# Patient Record
Sex: Male | Born: 1975 | Race: Black or African American | Hispanic: No | Marital: Single | Smoking: Current every day smoker
Health system: Southern US, Community
[De-identification: ages and names within clinical notes are randomized; demographics above are authoritative.]

## PROBLEM LIST (undated history)

## (undated) DIAGNOSIS — K219 Gastro-esophageal reflux disease without esophagitis: Secondary | ICD-10-CM

## (undated) HISTORY — PX: NO PAST SURGERIES: SHX2092

---

## 2013-09-05 ENCOUNTER — Encounter (HOSPITAL_COMMUNITY): Payer: Self-pay | Admitting: Emergency Medicine

## 2013-09-05 ENCOUNTER — Emergency Department (HOSPITAL_COMMUNITY)
Admission: EM | Admit: 2013-09-05 | Discharge: 2013-09-05 | Disposition: A | Discharge status: Home / Self Care | Payer: Self-pay | Attending: Emergency Medicine | Admitting: Emergency Medicine

## 2013-09-05 DIAGNOSIS — S199XXA Unspecified injury of neck, initial encounter: Secondary | ICD-10-CM

## 2013-09-05 DIAGNOSIS — R519 Headache, unspecified: Secondary | ICD-10-CM

## 2013-09-05 DIAGNOSIS — S0993XA Unspecified injury of face, initial encounter: Secondary | ICD-10-CM | POA: Insufficient documentation

## 2013-09-05 DIAGNOSIS — K5289 Other specified noninfective gastroenteritis and colitis: Secondary | ICD-10-CM | POA: Insufficient documentation

## 2013-09-05 DIAGNOSIS — K529 Noninfective gastroenteritis and colitis, unspecified: Secondary | ICD-10-CM

## 2013-09-05 DIAGNOSIS — S0990XA Unspecified injury of head, initial encounter: Secondary | ICD-10-CM | POA: Insufficient documentation

## 2013-09-05 DIAGNOSIS — R51 Headache: Secondary | ICD-10-CM

## 2013-09-05 LAB — URINALYSIS, ROUTINE W REFLEX MICROSCOPIC
Glucose, UA: NEGATIVE mg/dL
Hgb urine dipstick: NEGATIVE
Ketones, ur: NEGATIVE mg/dL
Leukocytes, UA: NEGATIVE
NITRITE: NEGATIVE
Protein, ur: 30 mg/dL — AB
Specific Gravity, Urine: 1.043 — ABNORMAL HIGH (ref 1.005–1.030)
UROBILINOGEN UA: 0.2 mg/dL (ref 0.0–1.0)
pH: 5.5 (ref 5.0–8.0)

## 2013-09-05 LAB — COMPREHENSIVE METABOLIC PANEL
ALT: 12 U/L (ref 0–53)
AST: 15 U/L (ref 0–37)
Albumin: 3.7 g/dL (ref 3.5–5.2)
Alkaline Phosphatase: 63 U/L (ref 39–117)
BILIRUBIN TOTAL: 0.4 mg/dL (ref 0.3–1.2)
BUN: 9 mg/dL (ref 6–23)
CALCIUM: 9 mg/dL (ref 8.4–10.5)
CHLORIDE: 98 meq/L (ref 96–112)
CO2: 22 mEq/L (ref 19–32)
CREATININE: 0.91 mg/dL (ref 0.50–1.35)
GLUCOSE: 144 mg/dL — AB (ref 70–99)
Potassium: 3.5 mEq/L — ABNORMAL LOW (ref 3.7–5.3)
Sodium: 136 mEq/L — ABNORMAL LOW (ref 137–147)
Total Protein: 6.9 g/dL (ref 6.0–8.3)

## 2013-09-05 LAB — CBC WITH DIFFERENTIAL/PLATELET
Basophils Absolute: 0 10*3/uL (ref 0.0–0.1)
Basophils Relative: 0 % (ref 0–1)
EOS PCT: 1 % (ref 0–5)
Eosinophils Absolute: 0.1 10*3/uL (ref 0.0–0.7)
HCT: 38.1 % — ABNORMAL LOW (ref 39.0–52.0)
HEMOGLOBIN: 13.3 g/dL (ref 13.0–17.0)
LYMPHS PCT: 6 % — AB (ref 12–46)
Lymphs Abs: 0.5 10*3/uL — ABNORMAL LOW (ref 0.7–4.0)
MCH: 30.6 pg (ref 26.0–34.0)
MCHC: 34.9 g/dL (ref 30.0–36.0)
MCV: 87.6 fL (ref 78.0–100.0)
MONO ABS: 0.2 10*3/uL (ref 0.1–1.0)
MONOS PCT: 3 % (ref 3–12)
NEUTROS ABS: 7.7 10*3/uL (ref 1.7–7.7)
Neutrophils Relative %: 90 % — ABNORMAL HIGH (ref 43–77)
Platelets: 199 10*3/uL (ref 150–400)
RBC: 4.35 MIL/uL (ref 4.22–5.81)
RDW: 12.3 % (ref 11.5–15.5)
WBC: 8.5 10*3/uL (ref 4.0–10.5)

## 2013-09-05 LAB — URINE MICROSCOPIC-ADD ON

## 2013-09-05 LAB — GC/CHLAMYDIA PROBE AMP
CT PROBE, AMP APTIMA: POSITIVE — AB
GC Probe RNA: NEGATIVE

## 2013-09-05 MED ORDER — AZITHROMYCIN 250 MG PO TABS
1000.0000 mg | ORAL_TABLET | Freq: Once | ORAL | Status: AC
  Administered 2013-09-05: 1000 mg via ORAL
  Filled 2013-09-05: qty 4

## 2013-09-05 MED ORDER — ONDANSETRON HCL 4 MG/2ML IJ SOLN
4.0000 mg | Freq: Once | INTRAMUSCULAR | Status: AC
  Administered 2013-09-05: 4 mg via INTRAVENOUS
  Filled 2013-09-05: qty 2

## 2013-09-05 MED ORDER — AZITHROMYCIN 250 MG PO TABS: 1000.0000 mg | ORAL_TABLET | Freq: Once | ORAL | Status: DC

## 2013-09-05 MED ORDER — MORPHINE SULFATE 4 MG/ML IJ SOLN
4.0000 mg | Freq: Once | INTRAMUSCULAR | Status: AC
Start: 2013-09-05 — End: 2013-09-05
  Administered 2013-09-05: 4 mg via INTRAVENOUS
  Filled 2013-09-05: qty 1

## 2013-09-05 MED ORDER — CEFTRIAXONE SODIUM 250 MG IJ SOLR
250.0000 mg | Freq: Once | INTRAMUSCULAR | Status: AC
  Administered 2013-09-05: 250 mg via INTRAMUSCULAR
  Filled 2013-09-05: qty 250

## 2013-09-05 MED ORDER — ONDANSETRON 4 MG PO TBDP: 4.0000 mg | ORAL_TABLET | Freq: Three times a day (TID) | ORAL | Status: DC | PRN

## 2013-09-05 MED ORDER — SODIUM CHLORIDE 0.9 % IV BOLUS (SEPSIS)
1000.0000 mL | Freq: Once | INTRAVENOUS | Status: AC
  Administered 2013-09-05: 1000 mL via INTRAVENOUS
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
## 2013-09-05 NOTE — ED Notes (Signed)
Pt is unable to void at this time.  

## 2013-09-05 NOTE — ED Notes (Signed)
Patient vomited on floor of room--Zithromax tabs noted PA made aware

## 2013-09-05 NOTE — ED Notes (Signed)
Patient now states that he wants to be tested for STDs PA aware

## 2013-09-05 NOTE — Discharge Instructions (Signed)
Please follow up with your primary care physician in 1-2 days. If you do not have one please call the [facility] number listed above. Please take your antibiotic until completion. Please take Zofran as prescribed. Please read all discharge instructions and return precautions.   Viral Gastroenteritis Viral gastroenteritis is also known as stomach flu. This condition affects the stomach and intestinal tract. It can cause sudden diarrhea and vomiting. The illness typically lasts 3 to 8 days. Most people develop an immune response that eventually gets rid of the virus. While this natural response develops, the virus can make you quite ill. CAUSES  Many different viruses can cause gastroenteritis, such as rotavirus or noroviruses. You can catch one of these viruses by consuming contaminated food or water. You may also catch a virus by sharing utensils or other personal items with an infected person or by touching a contaminated surface. SYMPTOMS  The most common symptoms are diarrhea and vomiting. These problems can cause a severe loss of body fluids (dehydration) and a body salt (electrolyte) imbalance. Other symptoms may include:  Fever.  Headache.  Fatigue.  Abdominal pain. DIAGNOSIS  Your caregiver can usually diagnose viral gastroenteritis based on your symptoms and a physical exam. A stool sample may also be taken to test for the presence of viruses or other infections. TREATMENT  This illness typically goes away on its own. Treatments are aimed at rehydration. The most serious cases of viral gastroenteritis involve vomiting so severely that you are not able to keep fluids down. In these cases, fluids must be given through an intravenous line (IV). HOME CARE INSTRUCTIONS   Drink enough fluids to keep your urine clear or pale yellow. Drink small amounts of fluids frequently and increase the amounts as tolerated.  Ask your caregiver for specific rehydration  instructions.  Avoid:  Foods high in sugar.  Alcohol.  Carbonated drinks.  Tobacco.  Juice.  Caffeine drinks.  Extremely hot or cold fluids.  Fatty, greasy foods.  Too much intake of anything at one time.  Dairy products until 24 to 48 hours after diarrhea stops.  You may consume probiotics. Probiotics are active cultures of beneficial bacteria. They may lessen the amount and number of diarrheal stools in adults. Probiotics can be found in yogurt with active cultures and in supplements.  Wash your hands well to avoid spreading the virus.  Only take over-the-counter or prescription medicines for pain, discomfort, or fever as directed by your caregiver. Do not give aspirin to children. Antidiarrheal medicines are not recommended.  Ask your caregiver if you should continue to take your regular prescribed and over-the-counter medicines.  Keep all follow-up appointments as directed by your caregiver. SEEK IMMEDIATE MEDICAL CARE IF:   You are unable to keep fluids down.  You do not urinate at least once every 6 to 8 hours.  You develop shortness of breath.  You notice blood in your stool or vomit. This may look like coffee grounds.  You have abdominal pain that increases or is concentrated in one small area (localized).  You have persistent vomiting or diarrhea.  You have a fever.  The patient is a child younger than 3 months, and he or she has a fever.  The patient is a child older than 3 months, and he or she has a fever and persistent symptoms.  The patient is a child older than 3 months, and he or she has a fever and symptoms suddenly get worse.  The patient  is a baby, and he or she has no tears when crying. MAKE SURE YOU:   Understand these instructions.  Will watch your condition.  Will get help right away if you are not doing well or get worse. Document Released: 07/03/2005 Document Revised: 09/25/2011 Document Reviewed: 04/19/2011 [facility] Patient  Information 2014 [place], [place].  Sexually Transmitted Disease A sexually transmitted disease (STD) is a disease or infection that may be passed (transmitted) from person to person, usually during sexual activity. This may happen by way of saliva, semen, blood, vaginal mucus, or urine. Common STDs include:   Gonorrhea.   Chlamydia.   Syphilis.   HIV and AIDS.   Genital herpes.   Hepatitis B and C.   Trichomonas.   Human papillomavirus (HPV).   Pubic lice.   Scabies.  Mites.  Bacterial vaginosis. WHAT ARE CAUSES OF STDs? An STD may be caused by bacteria, a virus, or parasites. STDs are often transmitted during sexual activity if one person is infected. However, they may also be transmitted through nonsexual means. STDs may be transmitted after:   Sexual intercourse with an infected person.   Sharing sex toys with an infected person.   Sharing needles with an infected person or using unclean piercing or tattoo needles.  Having intimate contact with the genitals, mouth, or rectal areas of an infected person.   Exposure to infected fluids during birth. WHAT ARE THE SIGNS AND SYMPTOMS OF STDs? Different STDs have different symptoms. Some people may not have any symptoms. If symptoms are present, they may include:   Painful or bloody urination.   Pain in the pelvis, abdomen, vagina, anus, throat, or eyes.   Skin rash, itching, irritation, growths, sores (lesions), ulcerations, or warts in the genital or anal area.  Abnormal vaginal discharge with or without bad odor.   Penile discharge in men.   Fever.   Pain or bleeding during sexual intercourse.   Swollen glands in the groin area.   Yellow skin and eyes (jaundice). This is seen with hepatitis.   Swollen testicles.  Infertility.  Sores and blisters in the mouth. HOW ARE STDs DIAGNOSED? To make a diagnosis, your health care provider may:   Take a medical history.   Perform a  physical exam.   Take a sample of any discharge for examination.  Swab the throat, cervix, opening to the penis, rectum, or vagina for testing.  Test a sample of your first morning urine.   Perform blood tests.   Perform a Pap smear, if this applies.   Perform a colposcopy.   Perform a laparoscopy.  HOW ARE STDs TREATED? Treatment depends on the STD. Some STDs may be treated but not cured.   Chlamydia, gonorrhea, trichomonas, and syphilis can be cured with antibiotics.   Genital herpes, hepatitis, and HIV can be treated, but not cured, with prescribed medicines. The medicines lessen symptoms.   Genital warts from HPV can be treated with medicine or by freezing, burning (electrocautery), or surgery. Warts may come back.   HPV cannot be cured with medicine or surgery. However, abnormal areas may be removed from the cervix, vagina, or vulva.   If your diagnosis is confirmed, your recent sexual partners need treatment. This is true even if they are symptom-free or have a negative culture or evaluation. They should not have sex until their health care providers say it is OK. HOW CAN I REDUCE MY RISK OF GETTING AN STD?  Use latex condoms, dental dams, and water-soluble lubricants  during sexual activity. Do not use petroleum jelly or oils.  Get vaccinated for HPV and hepatitis. If you have not received these vaccines in the past, talk to your health care provider about whether one or both might be right for you.   Avoid risky sex practices that can break the skin.  WHAT SHOULD I DO IF I THINK I HAVE AN STD?  See your health care provider.   Inform all sexual partners. They should be tested and treated for any STDs.  Do not have sex until your health care provider says it is OK. WHEN SHOULD I GET HELP? Seek immediate medical care if:  You develop severe abdominal pain.  You are a man and notice swelling or pain in the testicles.  You are a woman and notice  swelling or pain in your vagina. Document Released: 09/23/2002 Document Revised: 04/23/2013 Document Reviewed: 01/21/2013 [facility] Patient Information 2014 [place], [place].

## 2013-09-05 NOTE — ED Notes (Signed)
[facility] Expected date:  Expected time:  Means of arrival:  Comments: EMS/N/V/D

## 2013-09-05 NOTE — ED Provider Notes (Signed)
Medical screening examination/treatment/procedure(s) were performed by non-physician practitioner and as supervising physician I was immediately available for consultation/collaboration.  EKG Interpretation   None        Nidia Grogan K Longino Trefz-Rasch, MD 09/05/13 0613 

## 2013-09-05 NOTE — ED Notes (Signed)
Pt unable to void at this time. 
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
## 2013-09-05 NOTE — ED Notes (Signed)
[id number] house

## 2013-09-08 ENCOUNTER — Telehealth (HOSPITAL_COMMUNITY): Payer: Self-pay | Admitting: *Deleted

## 2013-09-26 ENCOUNTER — Encounter (HOSPITAL_COMMUNITY): Payer: Self-pay | Admitting: Emergency Medicine

## 2013-09-26 ENCOUNTER — Emergency Department (HOSPITAL_COMMUNITY)
Admission: EM | Admit: 2013-09-26 | Discharge: 2013-09-26 | Disposition: A | Discharge status: Home / Self Care | Payer: Self-pay | Attending: Emergency Medicine | Admitting: Emergency Medicine

## 2013-09-26 DIAGNOSIS — A64 Unspecified sexually transmitted disease: Secondary | ICD-10-CM | POA: Insufficient documentation

## 2013-09-26 DIAGNOSIS — R110 Nausea: Secondary | ICD-10-CM | POA: Insufficient documentation

## 2013-09-26 LAB — BASIC METABOLIC PANEL
BUN: 11 mg/dL (ref 6–23)
CHLORIDE: 104 meq/L (ref 96–112)
CO2: 26 meq/L (ref 19–32)
CREATININE: 0.99 mg/dL (ref 0.50–1.35)
Calcium: 9.4 mg/dL (ref 8.4–10.5)
GFR calc Af Amer: >90 mL/min (ref >90)
GFR calc non Af Amer: >90 mL/min (ref >90)
Glucose, Bld: 83 mg/dL (ref 70–99)
Potassium: 3.5 mEq/L — ABNORMAL LOW (ref 3.7–5.3)
Sodium: 142 mEq/L (ref 137–147)

## 2013-09-26 LAB — CBC WITH DIFFERENTIAL/PLATELET
Basophils Absolute: 0 10*3/uL (ref 0.0–0.1)
Basophils Relative: 0 % (ref 0–1)
Eosinophils Absolute: 0.2 10*3/uL (ref 0.0–0.7)
Eosinophils Relative: 4 % (ref 0–5)
HCT: 40.7 % (ref 39.0–52.0)
HEMOGLOBIN: 14.3 g/dL (ref 13.0–17.0)
Lymphocytes Relative: 56 % — ABNORMAL HIGH (ref 12–46)
Lymphs Abs: 3 10*3/uL (ref 0.7–4.0)
MCH: 31.4 pg (ref 26.0–34.0)
MCHC: 35.1 g/dL (ref 30.0–36.0)
MCV: 89.5 fL (ref 78.0–100.0)
MONOS PCT: 8 % (ref 3–12)
Monocytes Absolute: 0.5 10*3/uL (ref 0.1–1.0)
NEUTROS ABS: 1.7 10*3/uL (ref 1.7–7.7)
Neutrophils Relative %: 31 % — ABNORMAL LOW (ref 43–77)
Platelets: 240 10*3/uL (ref 150–400)
RBC: 4.55 MIL/uL (ref 4.22–5.81)
RDW: 12.9 % (ref 11.5–15.5)
WBC: 5.5 10*3/uL (ref 4.0–10.5)

## 2013-09-26 LAB — URINALYSIS, ROUTINE W REFLEX MICROSCOPIC
Bilirubin Urine: NEGATIVE
GLUCOSE, UA: NEGATIVE mg/dL
Hgb urine dipstick: NEGATIVE
KETONES UR: NEGATIVE mg/dL
LEUKOCYTES UA: NEGATIVE
Nitrite: NEGATIVE
Protein, ur: NEGATIVE mg/dL
Specific Gravity, Urine: 1.018 (ref 1.005–1.030)
Urobilinogen, UA: 0.2 mg/dL (ref 0.0–1.0)
pH: 5.5 (ref 5.0–8.0)

## 2013-09-26 MED ORDER — DOXYCYCLINE HYCLATE 100 MG PO TABS
100.0000 mg | ORAL_TABLET | Freq: Once | ORAL | Status: AC
  Administered 2013-09-26: 100 mg via ORAL
  Filled 2013-09-26: qty 1

## 2013-09-26 MED ORDER — DOXYCYCLINE HYCLATE 100 MG PO CAPS: 100.0000 mg | ORAL_CAPSULE | Freq: Two times a day (BID) | ORAL | Status: DC

## 2013-09-26 MED ORDER — PROMETHAZINE HCL 25 MG PO TABS: 25.0000 mg | ORAL_TABLET | Freq: Four times a day (QID) | ORAL | Status: DC | PRN

## 2013-09-26 NOTE — ED Provider Notes (Signed)
Medical screening examination/treatment/procedure(s) were performed by non-physician practitioner and as supervising physician I was immediately available for consultation/collaboration.   EKG Interpretation None        William Luv Mish, MD 09/26/13 2319 
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
## 2013-09-26 NOTE — Discharge Instructions (Signed)
Sexually Transmitted Disease A sexually transmitted disease (STD) is a disease or infection that may be passed (transmitted) from person to person, usually during sexual activity. This may happen by way of saliva, semen, blood, vaginal mucus, or urine. Common STDs include:   Gonorrhea.   Chlamydia.   Syphilis.   HIV and AIDS.   Genital herpes.   Hepatitis B and C.   Trichomonas.   Human papillomavirus (HPV).   Pubic lice.   Scabies.  Mites.  Bacterial vaginosis. WHAT ARE CAUSES OF STDs? An STD may be caused by bacteria, a virus, or parasites. STDs are often transmitted during sexual activity if one person is infected. However, they may also be transmitted through nonsexual means. STDs may be transmitted after:   Sexual intercourse with an infected person.   Sharing sex toys with an infected person.   Sharing needles with an infected person or using unclean piercing or tattoo needles.  Having intimate contact with the genitals, mouth, or rectal areas of an infected person.   Exposure to infected fluids during birth. WHAT ARE THE SIGNS AND SYMPTOMS OF STDs? Different STDs have different symptoms. Some people may not have any symptoms. If symptoms are present, they may include:   Painful or bloody urination.   Pain in the pelvis, abdomen, vagina, anus, throat, or eyes.   Skin rash, itching, irritation, growths, sores (lesions), ulcerations, or warts in the genital or anal area.  Abnormal vaginal discharge with or without bad odor.   Penile discharge in men.   Fever.   Pain or bleeding during sexual intercourse.   Swollen glands in the groin area.   Yellow skin and eyes (jaundice). This is seen with hepatitis.   Swollen testicles.  Infertility.  Sores and blisters in the mouth. HOW ARE STDs DIAGNOSED? To make a diagnosis, your health care provider may:   Take a medical history.   Perform a physical exam.   Take a sample of any  discharge for examination.  Swab the throat, cervix, opening to the penis, rectum, or vagina for testing.  Test a sample of your first morning urine.   Perform blood tests.   Perform a Pap smear, if this applies.   Perform a colposcopy.   Perform a laparoscopy.  HOW ARE STDs TREATED? Treatment depends on the STD. Some STDs may be treated but not cured.   Chlamydia, gonorrhea, trichomonas, and syphilis can be cured with antibiotics.   Genital herpes, hepatitis, and HIV can be treated, but not cured, with prescribed medicines. The medicines lessen symptoms.   Genital warts from HPV can be treated with medicine or by freezing, burning (electrocautery), or surgery. Warts may come back.   HPV cannot be cured with medicine or surgery. However, abnormal areas may be removed from the cervix, vagina, or vulva.   If your diagnosis is confirmed, your recent sexual partners need treatment. This is true even if they are symptom-free or have a negative culture or evaluation. They should not have sex until their health care providers say it is OK. HOW CAN I REDUCE MY RISK OF GETTING AN STD?  Use latex condoms, dental dams, and water-soluble lubricants during sexual activity. Do not use petroleum jelly or oils.  Get vaccinated for HPV and hepatitis. If you have not received these vaccines in the past, talk to your health care provider about whether one or both might be right for you.   Avoid risky sex practices that can break the skin.  WHAT SHOULD   I DO IF I THINK I HAVE AN STD?  See your health care provider.   Inform all sexual partners. They should be tested and treated for any STDs.  Do not have sex until your health care provider says it is OK. WHEN SHOULD I GET HELP? Seek immediate medical care if:  You develop severe abdominal pain.  You are a man and notice swelling or pain in the testicles.  You are a woman and notice swelling or pain in your vagina. Document  Released: 09/23/2002 Document Revised: 04/23/2013 Document Reviewed: 01/21/2013 [employer] Patient Information 2014 [employer].  
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
## 2013-09-26 NOTE — ED Notes (Signed)
Pt alert, NAD, calm, interactive. 

## 2013-09-27 LAB — GC/CHLAMYDIA PROBE AMP
CT Probe RNA: NEGATIVE
GC Probe RNA: NEGATIVE

## 2014-04-21 ENCOUNTER — Emergency Department (HOSPITAL_COMMUNITY)
Admission: EM | Admit: 2014-04-21 | Discharge: 2014-04-21 | Disposition: A | Discharge status: Home / Self Care | Payer: Self-pay | Attending: Emergency Medicine | Admitting: Emergency Medicine

## 2014-04-21 ENCOUNTER — Encounter (HOSPITAL_COMMUNITY): Payer: Self-pay | Admitting: Emergency Medicine

## 2014-04-21 DIAGNOSIS — Z711 Person with feared health complaint in whom no diagnosis is made: Secondary | ICD-10-CM

## 2014-04-21 DIAGNOSIS — R300 Dysuria: Secondary | ICD-10-CM | POA: Insufficient documentation

## 2014-04-21 DIAGNOSIS — Z113 Encounter for screening for infections with a predominantly sexual mode of transmission: Secondary | ICD-10-CM | POA: Insufficient documentation

## 2014-04-21 LAB — URINALYSIS, ROUTINE W REFLEX MICROSCOPIC
Bilirubin Urine: NEGATIVE
GLUCOSE, UA: NEGATIVE mg/dL
HGB URINE DIPSTICK: NEGATIVE
Ketones, ur: NEGATIVE mg/dL
Nitrite: NEGATIVE
Protein, ur: NEGATIVE mg/dL
Specific Gravity, Urine: 1.019 (ref 1.005–1.030)
Urobilinogen, UA: 1 mg/dL (ref 0.0–1.0)
pH: 6 (ref 5.0–8.0)

## 2014-04-21 LAB — URINE MICROSCOPIC-ADD ON

## 2014-04-21 MED ORDER — STERILE WATER FOR INJECTION IJ SOLN
INTRAMUSCULAR | Status: AC
  Administered 2014-04-21: 10 mL
  Filled 2014-04-21: qty 10

## 2014-04-21 MED ORDER — AZITHROMYCIN 250 MG PO TABS
1000.0000 mg | ORAL_TABLET | Freq: Once | ORAL | Status: AC
Start: 2014-04-21 — End: 2014-04-21
  Administered 2014-04-21: 1000 mg via ORAL
  Filled 2014-04-21: qty 4

## 2014-04-21 MED ORDER — CEFTRIAXONE SODIUM 250 MG IJ SOLR
250.0000 mg | Freq: Once | INTRAMUSCULAR | Status: AC
  Administered 2014-04-21: 250 mg via INTRAMUSCULAR
  Filled 2014-04-21: qty 250

## 2014-04-21 NOTE — ED Provider Notes (Signed)
Medical screening examination/treatment/procedure(s) were performed by non-physician practitioner and as supervising physician I was immediately available for consultation/collaboration.    Kamoni Gentles, MD 04/21/14 1614 
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
## 2014-04-21 NOTE — Discharge Instructions (Signed)
You were treated today for both gonorrhea and Chlamydia. If these tests results positive, you will be contacted and are than obligated to inform your partner. Sexually Transmitted Disease A sexually transmitted disease (STD) is a disease or infection that may be passed (transmitted) from person to person, usually during sexual activity. This may happen by way of saliva, semen, blood, vaginal mucus, or urine. Common STDs include:   Gonorrhea.   Chlamydia.   Syphilis.   HIV and AIDS.   Genital herpes.   Hepatitis B and C.   Trichomonas.   Human papillomavirus (HPV).   Pubic lice.   Scabies.  Mites.  Bacterial vaginosis. WHAT ARE CAUSES OF STDs? An STD may be caused by bacteria, a virus, or parasites. STDs are often transmitted during sexual activity if one person is infected. However, they may also be transmitted through nonsexual means. STDs may be transmitted after:   Sexual intercourse with an infected person.   Sharing sex toys with an infected person.   Sharing needles with an infected person or using unclean piercing or tattoo needles.  Having intimate contact with the genitals, mouth, or rectal areas of an infected person.   Exposure to infected fluids during birth. WHAT ARE THE SIGNS AND SYMPTOMS OF STDs? Different STDs have different symptoms. Some people may not have any symptoms. If symptoms are present, they may include:   Painful or bloody urination.   Pain in the pelvis, abdomen, vagina, anus, throat, or eyes.   A skin rash, itching, or irritation.  Growths, ulcerations, blisters, or sores in the genital and anal areas.  Abnormal vaginal discharge with or without bad odor.   Penile discharge in men.   Fever.   Pain or bleeding during sexual intercourse.   Swollen glands in the groin area.   Yellow skin and eyes (jaundice). This is seen with hepatitis.   Swollen testicles.  Infertility.  Sores and blisters in the  mouth. HOW ARE STDs DIAGNOSED? To make a diagnosis, your health care provider may:   Take a medical history.   Perform a physical exam.   Take a sample of any discharge to examine.  Swab the throat, cervix, opening to the penis, rectum, or vagina for testing.  Test a sample of your first morning urine.   Perform blood tests.   Perform a Pap test, if this applies.   Perform a colposcopy.   Perform a laparoscopy.  HOW ARE STDs TREATED? Treatment depends on the STD. Some STDs may be treated but not cured.   Chlamydia, gonorrhea, trichomonas, and syphilis can be cured with antibiotic medicine.   Genital herpes, hepatitis, and HIV can be treated, but not cured, with prescribed medicines. The medicines lessen symptoms.   Genital warts from HPV can be treated with medicine or by freezing, burning (electrocautery), or surgery. Warts may come back.   HPV cannot be cured with medicine or surgery. However, abnormal areas may be removed from the cervix, vagina, or vulva.   If your diagnosis is confirmed, your recent sexual partners need treatment. This is true even if they are symptom-free or have a negative culture or evaluation. They should not have sex until their health care providers say it is okay. HOW CAN I REDUCE MY RISK OF GETTING AN STD? Take these steps to reduce your risk of getting an STD:  Use latex condoms, dental dams, and water-soluble lubricants during sexual activity. Do not use petroleum jelly or oils.  Avoid having multiple sex partners.  Do not have sex with someone who has other sex partners.  Do not have sex with anyone you do not know or who is at high risk for an STD.  Avoid risky sex practices that can break your skin.  Do not have sex if you have open sores on your mouth or skin.  Avoid drinking too much alcohol or taking illegal drugs. Alcohol and drugs can affect your judgment and put you in a vulnerable position.  Avoid engaging in oral  and anal sex acts.  Get vaccinated for HPV and hepatitis. If you have not received these vaccines in the past, talk to your health care provider about whether one or both might be right for you.   If you are at risk of being infected with HIV, it is recommended that you take a prescription medicine daily to prevent HIV infection. This is called pre-exposure prophylaxis (PrEP). You are considered at risk if:  You are a man who has sex with other men (MSM).  You are a heterosexual man or woman and are sexually active with more than one partner.  You take drugs by injection.  You are sexually active with a partner who has HIV.  Talk with your health care provider about whether you are at high risk of being infected with HIV. If you choose to begin PrEP, you should first be tested for HIV. You should then be tested every 3 months for as long as you are taking PrEP.  WHAT SHOULD I DO IF I THINK I HAVE AN STD?  See your health care provider.   Tell your sexual partner(s). They should be tested and treated for any STDs.  Do not have sex until your health care provider says it is okay. WHEN SHOULD I GET IMMEDIATE MEDICAL CARE? Contact your health care provider right away if:   You have severe abdominal pain.  You are a man and notice swelling or pain in your testicles.  You are a woman and notice swelling or pain in your vagina. Document Released: 09/23/2002 Document Revised: 07/08/2013 Document Reviewed: 01/21/2013 [facility] Patient Information 2015 [employer], [place]. This information is not intended to replace advice given to you by your health care provider. Make sure you discuss any questions you have with your health care provider.

## 2014-04-22 LAB — GC/CHLAMYDIA PROBE AMP
CT Probe RNA: NEGATIVE
GC PROBE AMP APTIMA: NEGATIVE

## 2015-01-07 ENCOUNTER — Emergency Department (HOSPITAL_COMMUNITY)
Admission: EM | Admit: 2015-01-07 | Discharge: 2015-01-08 | Disposition: A | Discharge status: Home / Self Care | Payer: Self-pay | Attending: Emergency Medicine | Admitting: Emergency Medicine

## 2015-01-07 ENCOUNTER — Encounter (HOSPITAL_COMMUNITY): Payer: Self-pay | Admitting: *Deleted

## 2015-01-07 DIAGNOSIS — Z720 Tobacco use: Secondary | ICD-10-CM | POA: Insufficient documentation

## 2015-01-07 DIAGNOSIS — R109 Unspecified abdominal pain: Secondary | ICD-10-CM | POA: Insufficient documentation

## 2015-01-07 DIAGNOSIS — K219 Gastro-esophageal reflux disease without esophagitis: Secondary | ICD-10-CM | POA: Insufficient documentation

## 2015-01-07 DIAGNOSIS — R110 Nausea: Secondary | ICD-10-CM | POA: Insufficient documentation

## 2015-01-07 HISTORY — DX: Gastro-esophageal reflux disease without esophagitis: K21.9

## 2015-01-07 LAB — CBC WITH DIFFERENTIAL/PLATELET
BASOS PCT: 0 % (ref 0–1)
Basophils Absolute: 0 10*3/uL (ref 0.0–0.1)
EOS ABS: 0.5 10*3/uL (ref 0.0–0.7)
Eosinophils Relative: 6 % — ABNORMAL HIGH (ref 0–5)
HEMATOCRIT: 46.3 % (ref 39.0–52.0)
Hemoglobin: 15.7 g/dL (ref 13.0–17.0)
Lymphocytes Relative: 41 % (ref 12–46)
Lymphs Abs: 3.1 10*3/uL (ref 0.7–4.0)
MCH: 32 pg (ref 26.0–34.0)
MCHC: 33.9 g/dL (ref 30.0–36.0)
MCV: 94.3 fL (ref 78.0–100.0)
MONO ABS: 0.7 10*3/uL (ref 0.1–1.0)
Monocytes Relative: 9 % (ref 3–12)
Neutro Abs: 3.2 10*3/uL (ref 1.7–7.7)
Neutrophils Relative %: 44 % (ref 43–77)
PLATELETS: 213 10*3/uL (ref 150–400)
RBC: 4.91 MIL/uL (ref 4.22–5.81)
RDW: 12.8 % (ref 11.5–15.5)
WBC: 7.5 10*3/uL (ref 4.0–10.5)

## 2015-01-07 NOTE — ED Notes (Signed)
Attempted lab draw x 2 but unsuccessful.RN,Travia made aware.

## 2015-01-07 NOTE — ED Notes (Signed)
Pt reports lower abd pain since last night .  Pt reports he had same pain in the past and was told he had GERD.  Pt also reports nausea.

## 2015-01-08 LAB — COMPREHENSIVE METABOLIC PANEL
ALT: 20 U/L (ref 17–63)
ANION GAP: 8 (ref 5–15)
AST: 27 U/L (ref 15–41)
Albumin: 4.7 g/dL (ref 3.5–5.0)
Alkaline Phosphatase: 89 U/L (ref 38–126)
BILIRUBIN TOTAL: 0.5 mg/dL (ref 0.3–1.2)
BUN: 14 mg/dL (ref 6–20)
CO2: 28 mmol/L (ref 22–32)
CREATININE: 1.04 mg/dL (ref 0.61–1.24)
Calcium: 9.3 mg/dL (ref 8.9–10.3)
Chloride: 106 mmol/L (ref 101–111)
GFR calc non Af Amer: >60 mL/min (ref >60)
GLUCOSE: 105 mg/dL — AB (ref 65–99)
Potassium: 3.5 mmol/L (ref 3.5–5.1)
Sodium: 142 mmol/L (ref 135–145)
Total Protein: 7.5 g/dL (ref 6.5–8.1)

## 2015-01-08 LAB — URINALYSIS, ROUTINE W REFLEX MICROSCOPIC
Glucose, UA: NEGATIVE mg/dL
Hgb urine dipstick: NEGATIVE
Ketones, ur: NEGATIVE mg/dL
Leukocytes, UA: NEGATIVE
NITRITE: NEGATIVE
Protein, ur: NEGATIVE mg/dL
Specific Gravity, Urine: 1.034 — ABNORMAL HIGH (ref 1.005–1.030)
UROBILINOGEN UA: 1 mg/dL (ref 0.0–1.0)
pH: 5.5 (ref 5.0–8.0)

## 2015-01-08 LAB — LIPASE, BLOOD: Lipase: 27 U/L (ref 22–51)

## 2015-01-08 LAB — GC/CHLAMYDIA PROBE AMP (~~LOC~~) NOT AT ARMC
Chlamydia: NEGATIVE
NEISSERIA GONORRHEA: NEGATIVE

## 2015-01-08 MED ORDER — SUCRALFATE 1 GM/10ML PO SUSP: 0.5000 g | Freq: Three times a day (TID) | ORAL | Status: DC

## 2015-01-08 MED ORDER — SUCRALFATE 1 GM/10ML PO SUSP
0.5000 g | Freq: Once | ORAL | Status: AC
  Administered 2015-01-08: 0.5 g via ORAL
  Filled 2015-01-08: qty 10

## 2015-01-08 NOTE — Discharge Instructions (Signed)
Please follow up with your primary care physician in 1-2 days. If you do not have one please call the [facility] number listed above. Please read all discharge instructions and return precautions.  ° °Gastroesophageal Reflux Disease, Adult °Gastroesophageal reflux disease (GERD) happens when acid from your stomach flows up into the esophagus. When acid comes in contact with the esophagus, the acid causes soreness (inflammation) in the esophagus. Over time, GERD may create small holes (ulcers) in the lining of the esophagus. °CAUSES  °· Increased body weight. This puts pressure on the stomach, making acid rise from the stomach into the esophagus. °· Smoking. This increases acid production in the stomach. °· Drinking alcohol. This causes decreased pressure in the lower esophageal sphincter (valve or ring of muscle between the esophagus and stomach), allowing acid from the stomach into the esophagus. °· Late evening meals and a full stomach. This increases pressure and acid production in the stomach. °· A malformed lower esophageal sphincter. °Sometimes, no cause is found. °SYMPTOMS  °· Burning pain in the lower part of the mid-chest behind the breastbone and in the mid-stomach area. This may occur twice a week or more often. °· Trouble swallowing. °· Sore throat. °· Dry cough. °· Asthma-like symptoms including chest tightness, shortness of breath, or wheezing. °DIAGNOSIS  °Your caregiver may be able to diagnose GERD based on your symptoms. In some cases, X-rays and other tests may be done to check for complications or to check the condition of your stomach and esophagus. °TREATMENT  °Your caregiver may recommend over-the-counter or prescription medicines to help decrease acid production. Ask your caregiver before starting or adding any new medicines.  °HOME CARE INSTRUCTIONS  °· Change the factors that you can control. Ask your caregiver for guidance concerning weight loss, quitting smoking, and  alcohol consumption. °· Avoid foods and drinks that make your symptoms worse, such as: °¨ Caffeine or alcoholic drinks. °¨ Chocolate. °¨ Peppermint or mint flavorings. °¨ Garlic and onions. °¨ Spicy foods. °¨ Citrus fruits, such as oranges, lemons, or limes. °¨ Tomato-based foods such as sauce, chili, salsa, and pizza. °¨ Fried and fatty foods. °· Avoid lying down for the 3 hours prior to your bedtime or prior to taking a nap. °· Eat small, frequent meals instead of large meals. °· Wear loose-fitting clothing. Do not wear anything tight around your waist that causes pressure on your stomach. °· Raise the head of your bed 6 to 8 inches with wood blocks to help you sleep. Extra pillows will not help. °· Only take over-the-counter or prescription medicines for pain, discomfort, or fever as directed by your caregiver. °· Do not take aspirin, ibuprofen, or other nonsteroidal anti-inflammatory drugs (NSAIDs). °SEEK IMMEDIATE MEDICAL CARE IF:  °· You have pain in your arms, neck, jaw, teeth, or back. °· Your pain increases or changes in intensity or duration. °· You develop nausea, vomiting, or sweating (diaphoresis). °· You develop shortness of breath, or you faint. °· Your vomit is green, yellow, black, or looks like coffee grounds or blood. °· Your stool is red, bloody, or black. °These symptoms could be signs of other problems, such as heart disease, gastric bleeding, or esophageal bleeding. °MAKE SURE YOU:  °· Understand these instructions. °· Will watch your condition. °· Will get help right away if you are not doing well or get worse. °Document Released: 04/12/2005 Document Revised: 09/25/2011 Document Reviewed: 01/20/2011 °ExitCare® Patient Information ©2015 [employer]. This information is not intended to replace   advice given to you by your health care provider. Make sure you discuss any questions you have with your health care provider. ° °

## 2015-01-08 NOTE — ED Provider Notes (Signed)
CSN: [id number]     Arrival date & time 01/07/15  2302 History   First MD Initiated Contact with Patient 01/07/15 2358     Chief Complaint  Patient presents with  . Abdominal Pain     (Consider location/radiation/quality/duration/timing/severity/associated sxs/prior Treatment) HPI Comments: Patient is a 39 year old male past medical history significant for tobacco abuse, GERD presenting to the emergency department for mid abdominal pain with associated "gargling" and nausea. He states he's had recurrent symptoms, last episode was earlier this evening. He states his symptoms are usually brought on with eating late at night, laying down after eating or eating fried or greasy or heavy foods. He has not tried any medications prior to arrival. No modifying factors identified. Patient is also requesting STD testing. He denies any fevers, vomiting, diarrhea, penile or testicular pain or swelling, penile discharge, urinary symptoms.  Patient is a 39 y.o. male presenting with abdominal pain.  Abdominal Pain Associated symptoms: nausea   Associated symptoms: no diarrhea, no hematuria and no vomiting     Past Medical History  Diagnosis Date  . GERD (gastroesophageal reflux disease)    History reviewed. No pertinent past surgical history. No family history on file. History  Substance Use Topics  . Smoking status: Current Every Day Smoker -- 0.50 packs/day    Types: Cigarettes  . Smokeless tobacco: Not on file  . Alcohol Use: Yes    Review of Systems  Gastrointestinal: Positive for nausea and abdominal pain. Negative for vomiting and diarrhea.  Genitourinary: Negative for hematuria, penile swelling, scrotal swelling, penile pain and testicular pain.  All other systems reviewed and are negative.     Allergies  Review of patient's allergies indicates no known allergies.  Home Medications   Prior to Admission medications   Medication Sig Start Date End Date Taking? Authorizing Provider   sucralfate (CARAFATE) 1 GM/10ML suspension Take 5 mLs (0.5 g total) by mouth 4 (four) times daily -  with meals and at bedtime. 01/08/15   Caelin Rayl, PA-C   BP 126/72 mmHg  Pulse 62  Temp(Src) 98.2 F (36.8 C) (Oral)  Resp 18  SpO2 99% Physical Exam  Constitutional: He is oriented to person, place, and time. He appears well-developed and well-nourished. No distress.  HENT:  Head: Normocephalic and atraumatic.  Right Ear: External ear normal.  Left Ear: External ear normal.  Nose: Nose normal.  Mouth/Throat: Oropharynx is clear and moist.  Eyes: Conjunctivae are normal.  Neck: Normal range of motion. Neck supple.  No nuchal rigidity.   Cardiovascular: Normal rate, regular rhythm and normal heart sounds.   Pulmonary/Chest: Effort normal and breath sounds normal.  Abdominal: Soft. Bowel sounds are normal. He exhibits no distension. There is no tenderness. There is no rebound and no guarding.  Musculoskeletal: Normal range of motion.  Neurological: He is alert and oriented to person, place, and time.  Skin: Skin is warm and dry. He is not diaphoretic.  Psychiatric: He has a normal mood and affect.  Nursing note and vitals reviewed.   ED Course  Procedures (including critical care time) Medications  sucralfate (CARAFATE) 1 GM/10ML suspension 0.5 g (0.5 g Oral Given 01/08/15 0049)    Labs Review Labs Reviewed  CBC WITH DIFFERENTIAL/PLATELET - Abnormal; Notable for the following:    Eosinophils Relative 6 (*)    All other components within normal limits  COMPREHENSIVE METABOLIC PANEL - Abnormal; Notable for the following:    Glucose, Bld 105 (*)    All other components  within normal limits  URINALYSIS, ROUTINE W REFLEX MICROSCOPIC (NOT AT [facility]) - Abnormal; Notable for the following:    Specific Gravity, Urine 1.034 (*)    Bilirubin Urine SMALL (*)    All other components within normal limits  LIPASE, BLOOD  HIV ANTIBODY (ROUTINE TESTING)  RPR  GC/CHLAMYDIA PROBE  AMP ([place]) NOT AT [facility]    Imaging Review No results found.   EKG Interpretation None      MDM   Final diagnoses:  Abdominal pain in male    Filed Vitals:   01/08/15 0242  BP: 126/72  Pulse: 62  Temp: 98.2 F (36.8 C)  Resp: 18   Afebrile, NAD, non-toxic appearing, AAOx4.   Patient is nontoxic, nonseptic appearing, in no apparent distress.  Patient's pain and other symptoms adequately managed in emergency department. Labs and vitals reviewed.  Patient does not meet the SIRS or Sepsis criteria.  No peritoneal signs.  No indication of appendicitis, bowel obstruction, bowel perforation, cholecystitis, diverticulitis. GC/Chlamydia, RPR, and HIV sent. No symptoms to suggest infection at this time, patient agreeable to holding treatment until test results are back.  Patient discharged home with symptomatic treatment and given strict instructions for follow-up with their primary care physician.  I have also discussed reasons to return immediately to the ER.  Patient expresses understanding and agrees with plan.Patient is stable at time of discharge        Francee Piccolo, PA-C 01/08/15 0981  Shon Baton, MD 01/08/15 [phone]

## 2015-01-11 ENCOUNTER — Encounter (HOSPITAL_COMMUNITY): Payer: Self-pay | Admitting: Emergency Medicine

## 2015-01-11 ENCOUNTER — Emergency Department (HOSPITAL_COMMUNITY)
Admission: EM | Admit: 2015-01-11 | Discharge: 2015-01-12 | Disposition: A | Discharge status: Home / Self Care | Payer: Self-pay | Attending: Emergency Medicine | Admitting: Emergency Medicine

## 2015-01-11 DIAGNOSIS — Z720 Tobacco use: Secondary | ICD-10-CM | POA: Insufficient documentation

## 2015-01-11 DIAGNOSIS — Z711 Person with feared health complaint in whom no diagnosis is made: Secondary | ICD-10-CM

## 2015-01-11 DIAGNOSIS — Z113 Encounter for screening for infections with a predominantly sexual mode of transmission: Secondary | ICD-10-CM | POA: Insufficient documentation

## 2015-01-11 DIAGNOSIS — K219 Gastro-esophageal reflux disease without esophagitis: Secondary | ICD-10-CM | POA: Insufficient documentation

## 2015-01-11 DIAGNOSIS — Z79899 Other long term (current) drug therapy: Secondary | ICD-10-CM | POA: Insufficient documentation

## 2015-01-11 DIAGNOSIS — Z202 Contact with and (suspected) exposure to infections with a predominantly sexual mode of transmission: Secondary | ICD-10-CM

## 2015-01-11 MED ORDER — LIDOCAINE HCL (PF) 1 % IJ SOLN
5.0000 mL | Freq: Once | INTRAMUSCULAR | Status: AC
  Administered 2015-01-12: 5 mL via INTRADERMAL
  Filled 2015-01-11: qty 5

## 2015-01-11 MED ORDER — CEFTRIAXONE SODIUM 250 MG IJ SOLR
250.0000 mg | Freq: Once | INTRAMUSCULAR | Status: AC
  Administered 2015-01-12: 250 mg via INTRAMUSCULAR
  Filled 2015-01-11: qty 250

## 2015-01-11 MED ORDER — AZITHROMYCIN 250 MG PO TABS
1000.0000 mg | ORAL_TABLET | Freq: Once | ORAL | Status: AC
  Administered 2015-01-12: 1000 mg via ORAL
  Filled 2015-01-11: qty 4

## 2015-01-11 NOTE — ED Provider Notes (Signed)
CSN: [id number]     Arrival date & time 01/11/15  2253 History  This chart was scribed for non-physician practitioner, Dierdre ForthHannah Kiani Wurtzel, PA-C working with Pricilla LovelessScott Goldston, MD, by Jarvis Morganaylor Ferguson, ED Scribe. This patient was seen in room TR05C/TR05C and the patient's care was started at 11:33 PM.    Chief Complaint  Patient presents with  . Exposure to STD    The history is provided by the patient and medical records. No language interpreter was used.    HPI Comments: Kyle Fischer is a 39 y.o. male who presents to the Emergency Department due a possible STD exposure. Pt states that his male sexual partner has tested positive for chlamydia infection. Pt states this is the only sexual partner he has had in the past 6 months. He notes some mild penile discomfort with urination but denies frank dysuria. He states he has had a STI in the past but it was when he was a teenager. He denies any dysuria, penile discharge, lesions, fever, chills, nausea, vomiting, testicular pain, pain with bowel movements.    Past Medical History  Diagnosis Date  . GERD (gastroesophageal reflux disease)    History reviewed. No pertinent past surgical history. No family history on file. History  Substance Use Topics  . Smoking status: Current Every Day Smoker -- 0.00 packs/day    Types: Cigarettes  . Smokeless tobacco: Not on file  . Alcohol Use: Yes    Review of Systems  Constitutional: Negative for fever and chills.  Gastrointestinal: Negative for nausea and vomiting.  Genitourinary: Positive for dysuria (discomfort). Negative for discharge, penile swelling, scrotal swelling, genital sores, penile pain and testicular pain.      Allergies  Review of patient's allergies indicates no known allergies.  Home Medications   Prior to Admission medications   Medication Sig Start Date End Date Taking? Authorizing Provider  sucralfate (CARAFATE) 1 GM/10ML suspension Take 5 mLs (0.5 g total) by mouth 4  (four) times daily -  with meals and at bedtime. 01/08/15   Francee PiccoloJennifer Piepenbrink, PA-C   Triage Vitals: BP 127/69 mmHg  Pulse 79  Temp(Src) 98.2 F (36.8 C) (Oral)  Resp 20  Wt 135 lb (61.236 kg)  SpO2 98%  Physical Exam  Constitutional: He appears well-developed and well-nourished. No distress.  Awake, alert, nontoxic appearance  HENT:  Head: Normocephalic and atraumatic.  Mouth/Throat: Oropharynx is clear and moist. No oropharyngeal exudate.  Eyes: Conjunctivae are normal. No scleral icterus.  Neck: Normal range of motion. Neck supple.  Cardiovascular: Normal rate, regular rhythm, normal heart sounds and intact distal pulses.   Pulmonary/Chest: Effort normal and breath sounds normal. No respiratory distress. He has no wheezes.  Equal chest expansion  Abdominal: Soft. Bowel sounds are normal. He exhibits no mass. There is no tenderness. There is no rebound and no guarding. Hernia confirmed negative in the right inguinal area and confirmed negative in the left inguinal area.  Genitourinary: Testes normal and penis normal. Cremasteric reflex is present. Right testis shows no mass, no swelling and no tenderness. Right testis is descended. Cremasteric reflex is not absent on the right side. Left testis shows no mass, no swelling and no tenderness. Left testis is descended. Cremasteric reflex is not absent on the left side. Circumcised. No phimosis, paraphimosis, hypospadias, penile erythema or penile tenderness. No discharge found.  Musculoskeletal: Normal range of motion. He exhibits no edema.  Lymphadenopathy:       Right: No inguinal adenopathy present.  Left: No inguinal adenopathy present.  Neurological: He is alert.  Speech is clear and goal oriented Moves extremities without ataxia  Skin: Skin is warm and dry. He is not diaphoretic.  Psychiatric: He has a normal mood and affect.  Nursing note and vitals reviewed.   ED Course  Procedures (including critical care  time)  DIAGNOSTIC STUDIES: Oxygen Saturation is 98% on RA, normal by my interpretation.    COORDINATION OF CARE: 11:42 PM- Will order RPR, HIV antibody, Rocephin and azithromycin.  Pt advised of plan for treatment and pt agrees.    Labs Review Labs Reviewed  RPR  HIV ANTIBODY (ROUTINE TESTING)  GC/CHLAMYDIA PROBE AMP ([place]) NOT AT [facility]    Imaging Review No results found.   EKG Interpretation None      MDM   Final diagnoses:  STD exposure  Concern about STD in male without diagnosis   Kyle Fischer presents with concerns for STD exposure.  Patient is afebrile without abdominal tenderness, abdominal pain or painful bowel movements to indicate prostatitis.  No tenderness to palpation of the testes or epididymis to suggest orchitis or epididymitis.  STD cultures obtained including HIV, syphilis, gonorrhea and chlamydia. Patient to be discharged with instructions to follow up with PCP. Discussed importance of using protection when sexually active. Pt understands that they have GC/Chlamydia cultures pending and that they will need to inform all sexual partners if results return positive. Patient has been treated prophylactically with azithromycin and Rocephin.   BP 127/69 mmHg  Pulse 79  Temp(Src) 98.2 F (36.8 C) (Oral)  Resp 20  Wt 135 lb (61.236 kg)  SpO2 98%  I personally performed the services described in this documentation, which was scribed in my presence. The recorded information has been reviewed and is accurate.   Dahlia Client Danie Hannig, PA-C 01/12/15 2956  Pricilla Loveless, MD 01/13/15 2121

## 2015-01-11 NOTE — ED Notes (Signed)
Pt. requesting STD screening , advised by his sexual partner that she has STD ( Chlamydia infection ) , pt. denies any symptoms .

## 2015-01-11 NOTE — ED Notes (Signed)
PA Hannah at bedside. 

## 2015-01-12 LAB — GC/CHLAMYDIA PROBE AMP (~~LOC~~) NOT AT ARMC
CHLAMYDIA, DNA PROBE: NEGATIVE
Neisseria Gonorrhea: NEGATIVE

## 2015-01-12 LAB — RPR: RPR: NONREACTIVE

## 2015-01-12 LAB — HIV ANTIBODY (ROUTINE TESTING W REFLEX): HIV Screen 4th Generation wRfx: NONREACTIVE

## 2015-01-12 NOTE — Discharge Instructions (Signed)
1. Medications: usual home medications 2. Treatment: rest, drink plenty of fluids, use a condom with every sexual encounter 3. Follow Up: Please followup with your primary doctor in 3 days for discussion of your diagnoses and further evaluation after today's visit; if you do not have a primary care doctor use the resource guide provided to find one; Please return to the ER for worsening symptoms, high fevers or persistent vomiting.  You have been tested for HIV, syphilis, chlamydia and gonorrhea.  These results will be available in approximately 3 days.  Please inform all sexual partners if you test positive for any of these diseases.   Sexually Transmitted Disease A sexually transmitted disease (STD) is a disease or infection that may be passed (transmitted) from person to person, usually during sexual activity. This may happen by way of saliva, semen, blood, vaginal mucus, or urine. Common STDs include:   Gonorrhea.   Chlamydia.   Syphilis.   HIV and AIDS.   Genital herpes.   Hepatitis B and C.   Trichomonas.   Human papillomavirus (HPV).   Pubic lice.   Scabies.  Mites.  Bacterial vaginosis. WHAT ARE CAUSES OF STDs? An STD may be caused by bacteria, a virus, or parasites. STDs are often transmitted during sexual activity if one person is infected. However, they may also be transmitted through nonsexual means. STDs may be transmitted after:   Sexual intercourse with an infected person.   Sharing sex toys with an infected person.   Sharing needles with an infected person or using unclean piercing or tattoo needles.  Having intimate contact with the genitals, mouth, or rectal areas of an infected person.   Exposure to infected fluids during birth. WHAT ARE THE SIGNS AND SYMPTOMS OF STDs? Different STDs have different symptoms. Some people may not have any symptoms. If symptoms are present, they may include:   Painful or bloody urination.   Pain in the  pelvis, abdomen, vagina, anus, throat, or eyes.   A skin rash, itching, or irritation.  Growths, ulcerations, blisters, or sores in the genital and anal areas.  Abnormal vaginal discharge with or without bad odor.   Penile discharge in men.   Fever.   Pain or bleeding during sexual intercourse.   Swollen glands in the groin area.   Yellow skin and eyes (jaundice). This is seen with hepatitis.   Swollen testicles.  Infertility.  Sores and blisters in the mouth. HOW ARE STDs DIAGNOSED? To make a diagnosis, your health care provider may:   Take a medical history.   Perform a physical exam.   Take a sample of any discharge to examine.  Swab the throat, cervix, opening to the penis, rectum, or vagina for testing.  Test a sample of your first morning urine.   Perform blood tests.   Perform a Pap test, if this applies.   Perform a colposcopy.   Perform a laparoscopy.  HOW ARE STDs TREATED? Treatment depends on the STD. Some STDs may be treated but not cured.   Chlamydia, gonorrhea, trichomonas, and syphilis can be cured with antibiotic medicine.   Genital herpes, hepatitis, and HIV can be treated, but not cured, with prescribed medicines. The medicines lessen symptoms.   Genital warts from HPV can be treated with medicine or by freezing, burning (electrocautery), or surgery. Warts may come back.   HPV cannot be cured with medicine or surgery. However, abnormal areas may be removed from the cervix, vagina, or vulva.   If your diagnosis  is confirmed, your recent sexual partners need treatment. This is true even if they are symptom-free or have a negative culture or evaluation. They should not have sex until their health care providers say it is okay. HOW CAN I REDUCE MY RISK OF GETTING AN STD? Take these steps to reduce your risk of getting an STD:  Use latex condoms, dental dams, and water-soluble lubricants during sexual activity. Do not use  petroleum jelly or oils.  Avoid having multiple sex partners.  Do not have sex with someone who has other sex partners.  Do not have sex with anyone you do not know or who is at high risk for an STD.  Avoid risky sex practices that can break your skin.  Do not have sex if you have open sores on your mouth or skin.  Avoid drinking too much alcohol or taking illegal drugs. Alcohol and drugs can affect your judgment and put you in a vulnerable position.  Avoid engaging in oral and anal sex acts.  Get vaccinated for HPV and hepatitis. If you have not received these vaccines in the past, talk to your health care provider about whether one or both might be right for you.   If you are at risk of being infected with HIV, it is recommended that you take a prescription medicine daily to prevent HIV infection. This is called pre-exposure prophylaxis (PrEP). You are considered at risk if:  You are a man who has sex with other men (MSM).  You are a heterosexual man or woman and are sexually active with more than one partner.  You take drugs by injection.  You are sexually active with a partner who has HIV.  Talk with your health care provider about whether you are at high risk of being infected with HIV. If you choose to begin PrEP, you should first be tested for HIV. You should then be tested every 3 months for as long as you are taking PrEP.  WHAT SHOULD I DO IF I THINK I HAVE AN STD?  See your health care provider.   Tell your sexual partner(s). They should be tested and treated for any STDs.  Do not have sex until your health care provider says it is okay. WHEN SHOULD I GET IMMEDIATE MEDICAL CARE? Contact your health care provider right away if:   You have severe abdominal pain.  You are a man and notice swelling or pain in your testicles.  You are a woman and notice swelling or pain in your vagina. Document Released: 09/23/2002 Document Revised: 07/08/2013 Document Reviewed:  01/21/2013 [facility] Patient Information 2015 [place], [place]. This information is not intended to replace advice given to you by your health care provider. Make sure you discuss any questions you have with your health care provider.

## 2015-09-29 ENCOUNTER — Emergency Department (HOSPITAL_COMMUNITY)
Admission: EM | Admit: 2015-09-29 | Discharge: 2015-09-29 | Disposition: A | Discharge status: Home / Self Care | Payer: BLUE CROSS/BLUE SHIELD | Attending: Emergency Medicine | Admitting: Emergency Medicine

## 2015-09-29 ENCOUNTER — Encounter (HOSPITAL_COMMUNITY): Payer: Self-pay | Admitting: Emergency Medicine

## 2015-09-29 DIAGNOSIS — N342 Other urethritis: Secondary | ICD-10-CM | POA: Diagnosis not present

## 2015-09-29 DIAGNOSIS — F17210 Nicotine dependence, cigarettes, uncomplicated: Secondary | ICD-10-CM | POA: Diagnosis not present

## 2015-09-29 DIAGNOSIS — A630 Anogenital (venereal) warts: Secondary | ICD-10-CM | POA: Diagnosis not present

## 2015-09-29 DIAGNOSIS — Z79899 Other long term (current) drug therapy: Secondary | ICD-10-CM | POA: Diagnosis not present

## 2015-09-29 DIAGNOSIS — K219 Gastro-esophageal reflux disease without esophagitis: Secondary | ICD-10-CM | POA: Diagnosis not present

## 2015-09-29 DIAGNOSIS — R21 Rash and other nonspecific skin eruption: Secondary | ICD-10-CM | POA: Diagnosis present

## 2015-09-29 LAB — URINALYSIS, ROUTINE W REFLEX MICROSCOPIC
Bilirubin Urine: NEGATIVE
Glucose, UA: NEGATIVE mg/dL
Hgb urine dipstick: NEGATIVE
Ketones, ur: NEGATIVE mg/dL
LEUKOCYTES UA: NEGATIVE
Nitrite: NEGATIVE
PH: 6 (ref 5.0–8.0)
Protein, ur: NEGATIVE mg/dL
Specific Gravity, Urine: 1.004 — ABNORMAL LOW (ref 1.005–1.030)

## 2015-09-29 MED ORDER — AZITHROMYCIN 250 MG PO TABS
1000.0000 mg | ORAL_TABLET | Freq: Once | ORAL | Status: AC
  Administered 2015-09-29: 1000 mg via ORAL
  Filled 2015-09-29: qty 4

## 2015-09-29 MED ORDER — IMIQUIMOD 5 % EX CREA: TOPICAL_CREAM | CUTANEOUS | Status: DC

## 2015-09-29 MED ORDER — STERILE WATER FOR INJECTION IJ SOLN
INTRAMUSCULAR | Status: AC
  Administered 2015-09-29: 10 mL
  Filled 2015-09-29: qty 10

## 2015-09-29 MED ORDER — CEFTRIAXONE SODIUM 250 MG IJ SOLR
250.0000 mg | Freq: Once | INTRAMUSCULAR | Status: AC
  Administered 2015-09-29: 250 mg via INTRAMUSCULAR
  Filled 2015-09-29: qty 250

## 2015-09-29 NOTE — Discharge Instructions (Signed)
You were not tested for all STDs today. Your gonorrhea and chlamydia tests are pending- if they are positive, you will receive a phone call. Refrain from sex until you have the results from a full STD screen. Please go to [facility] (Address: [address] [place], [place] [address] Phone: [phone]) or see the [facility] (Address: [address] Phone: [phone]) for full STD screening. Return to the emergency room for worsening of symptoms, fever, and vomiting.  Do not hesitate to return to the emergency room for any new, worsening or concerning symptoms.  Please obtain primary care using resource guide below. Let them know that you were seen in the emergency room and that they will need to obtain records for further outpatient management.     Genital Warts Genital warts are a common STD (sexually transmitted disease). They may appear as small bumps on the tissues of the genital area or anal area. Sometimes, they can become irritated and cause pain. Genital warts are easily passed to other people through sexual contact. Getting treatment is important because genital warts can lead to other problems. In females, the virus that causes genital warts may increase the risk of cervical cancer. CAUSES Genital warts are caused by a virus that is called human papillomavirus (HPV). HPV is spread by having unprotected sex with an infected person. It can be spread through vaginal, anal, and oral sex. Many people do not know that they are infected. They may be infected for years without problems. However, even if they do not have problems, they can pass the infection to their sexual partners. RISK FACTORS Genital warts are more likely to develop in:  People who have unprotected sex.  People who have multiple sexual partners.  People who become sexually active before they are 39 years of age.  Men who are not circumcised.  Women who have a male sexual partner who  is not circumcised.  People who have a weakened body defense system (immune system) due to disease or medicine.  People who smoke. SYMPTOMS Symptoms of genital warts include:  Small growths in the genital area or anal area. These warts often grow in clusters.  Itching and irritation in the genital area or anal area.  Bleeding from the warts.  Painful sexual intercourse. DIAGNOSIS Genital warts can usually be diagnosed from their appearance on the vagina, vulva, penis, perineum, anus, or rectum. Tests may also be done, such as:  Biopsy. A tissue sample is removed so it can be looked at under a microscope.  Colposcopy. In females, a magnifying tool is used to examine the vagina and cervix. Certain solutions may be used to make the HPV cells change color so they can be seen more easily.  A Pap test in females.  Tests for other STDs. TREATMENT Treatment for genital warts may include:  Applying prescription medicines to the warts. These may be solutions or creams.  Freezing the warts with liquid nitrogen (cryotherapy).  Burning the warts with:  Laser treatment.  An electrified probe (electrocautery).  Injecting a substance (Candida antigen or Trichophyton antigen) into the warts to help the body's immune system to fight off the warts.  Interferon injections.  Surgery to remove the warts. HOME CARE INSTRUCTIONS Medicines  Apply over-the-counter and prescription medicines only as told by your health care provider.  Do not treat genital warts with medicines that are used for treating hand warts.  Talk with your health care provider about using over-the-counter anti-itch creams. General  Instructions  Do not touch or scratch the warts.  Do not have sex until your treatment has been completed.  Tell your current and past sexual partners about your condition because they may also need treatment.  Keep all follow-up visits as told by your health care provider. This is  important.  After treatment, use condoms during sex to prevent future infections. Other Instructions for Women  Women who have genital warts might need increased screening for cervical cancer. This type of cancer is slow growing and can be cured if it is found early. Chances of developing cervical cancer are increased with HPV.  If you become pregnant, tell your health care provider that you have had HPV. Your health care provider will monitor you closely during pregnancy to be sure that your baby is safe. PREVENTION Talk with your health care provider about getting the HPV vaccines. These vaccines prevent some HPV infections and cancers. It is recommended that the vaccine be given to males and females who are 199-39 years of age. It will not work if you already have HPV, and it is not recommended for pregnant women. SEEK MEDICAL CARE IF:  You have redness, swelling, or pain in the area of the treated skin.  You have a fever.  You feel generally ill.  You feel lumps in and around your genital area or anal area.  You have bleeding in your genital area or anal area.  You have pain during sexual intercourse.   This information is not intended to replace advice given to you by your health care provider. Make sure you discuss any questions you have with your health care provider.   Document Released: 06/30/2000 Document Revised: 03/24/2015 Document Reviewed: 09/28/2014 Elsevier Interactive Patient Education 2016 [employer]  [employer] Resource Guide Financial Assistance The United Ways 211 is a great source of information about community services available.  Access by dialing 2-1-1 from anywhere in [place], or by website -  PooledIncome.plwww.nc211.org.   Other Local Resources (Updated 07/2015)  Financial Assistance   Services    Phone Number and Address  [facility]  Low-cost medical care - 1st and 3rd Saturday of every month  Must not qualify for public or private  insurance and must have limited income [phone] [address] [place], [place]    [place] [employer] [place] Department of Social Services  Child care  Emergency assistance for housing and [employer]ark[employer]ilities  Food stamps  Medicaid [phone] [address] [place], [place] [address]   [facility]  Low-cost medical care for children, communicable diseases, sexually-transmitted diseases, immunizations, maternity care, womens health and family planning [phone] [address] [place], [place] [address]  [facility]   Medication assistance for [facility] residents  Must meet income requirements [phone] [address] [place], [place].    [facility]  Child care  Emergency assistance for housing and [employer]ilities  Food stamps  Medicaid [phone] [address] [place], [place] [address]  [facility]   Low-cost medical care,   Monday through Friday, 9 am to 6 pm.   Accepts Medicare/Medicaid, and self-pay [phone] [address] [place], [place] [address]  [facility]  Low-cost medical care - Monday through Friday, 8:30 am - 5:30 pm  Accepts Medicaid and self-pay [phone] [address] [place], [place] [address]   [facility]  Primary medical care, including for those  with sickle cell disease  Accepts Medicare, Medicaid, insurance and self-pay [phone] [address] [place], [place]  [facility]   Primary medical care  Accepts Medicare, [place], insurance and self-pay [phone] [address] [place], [place] [address]   [facility] Department of Social Services  Child care  Emergency assistance for housing and [employer]  Medicaid [phone] [address] [place], [place] [address]  [facility] Department of Health and [employer]  Child care  Emergency assistance for housing and [employer]  Medicaid [phone] [address] [place], [place] [address]   [facility] Medication Assistance Program  Medication assistance for [facility] residents with no insurance only  Must have a primary care doctor [phone] [address] [place], [place]  [facility]   Primary medical care  [place], [place], insurance  [phone] [place], [place]  [facility]   Medication assistance [phone]  [facility]   Primary medical care  Accepts Medicare, [place], insurance and self-pay [phone] [address] [place], [place] [address]  [facility]   Primary medical care  Accepts Medicare, [place], insurance and self-pay [phone] [address] [place], [place] [address]  [facility]  For [place] residents between the ages of 65 and 44 who do not have any form of health insurance, Medicare, [place], or [place] benefits.  Services are provided free of charge to uninsured patients who fall within federal poverty guidelines.    Hours: Tuesdays and Thursdays, 4:15 - 8 pm [phone] [address] [place], [place] [address]  [facility]     Primary medical care  Dental care  Nutritional counseling  Pharmacy  Accepts Medicaid, Medicare, most insurance.  Fees are adjusted based on ability to pay.   [phone] [facility] [address] [place], [place]  [phone] [facility] [address] [place], [place]  [phone] [facility] [place], [place]  [phone] [facility], [address] [place], [place]  [phone] [facility] [address] [place], [place]   [facility]  Womens health and family planning [phone] [address] [place], [place]  [facility] Department of Social Services  Child care  Emergency assistance for housing and [employer]  Medicaid [phone] [address], [place], [place] [address]   [facility]    Ages 62 and older  Hours: Mondays and Thursdays, 7:00 am - 9:00 am Patients are seen on a first come, first served basis. [phone], [phone] [place], [place]  [facility] Division of Social Services  Child care  Emergency assistance for housing and [employer]  Medicaid [phone] [place], [place] [address]  [facility]  Medication assistance  Rental assistance  Food pantry  Medication assistance  Housing assistance  Emergency food distribution  Utility assistance [phone] [place], [place]  [phone]  [address] [place], [place] [address] Hours: Tuesdays and Thursdays from 9am - 12 noon by appointment only  [phone] [address] [place], [place] [address]  [facility] Lanae [facility]   Accepts private insurance, [place], and [place].  Payment is based on a sliding scale for those without insurance.  Hours: Mondays, Tuesdays and Thursdays, 8:30 am -  5:30 pm.   [phone] [address] [place], [place]  [facility] - Family Medicine at [facility], [place], and [place].  Payment is based on a sliding scale for those without insurance. [phone] [address] [place], [place]  [facility] - Pediatrics at E. Scientist, research (physical sciences), [employer], and [place].  Payment is based on a sliding scale for those without insurance [phone] [address], [employer], [place]  [facility] - Pediatrics  at [employer], [place], and [place].  Payment is based on a sliding scale for those without insurance. [phone] [address] [place], [place]  [facility] - Pediatrics at [facility], [place], and [place].  Payment is based on a sliding scale for those without insurance. [phone], [phone] [place], [place].    [facility]  Maternity care.  Accepts Medicaid and self-pay. [phone] [address] [place], [place]

## 2015-09-29 NOTE — ED Notes (Signed)
Patient here with complaint of rash to groin and concern for STD. States he was seen and treated for STI in January. Patient was given Abx at that time, but symptoms have returned. States that the rash started on the 12th of March.

## 2015-09-29 NOTE — ED Provider Notes (Signed)
CSN: [id number]     Arrival date & time 09/29/15  0441 History   First MD Initiated Contact with Patient 09/29/15 [phone]     Chief Complaint  Patient presents with  . Rash  . SEXUALLY TRANSMITTED DISEASE     (Consider location/radiation/quality/duration/timing/severity/associated sxs/prior Treatment) HPI   Blood pressure 122/65, pulse 69, temperature 97.9 F (36.6 C), temperature source Oral, resp. rate 19, height  (1.753 m), weight 65.772 kg, SpO2 96 %.  Kirklin Mcduffee is a 40 y.o. male complaining of pruritic and intermittently slightly painful rash at the base of the penis onset after he had protected sex but the condom broke 5 days ago. Patient also endorses mild dysuria with no hematuria, urethral discharge, testicular pain, swelling, fever, chills, abdominal pain, flulike symptoms.   Past Medical History  Diagnosis Date  . GERD (gastroesophageal reflux disease)    History reviewed. No pertinent past surgical history. History reviewed. No pertinent family history. Social History  Substance Use Topics  . Smoking status: Current Every Day Smoker -- 0.00 packs/day    Types: Cigarettes  . Smokeless tobacco: None  . Alcohol Use: Yes    Review of Systems  10 systems reviewed and found to be negative, except as noted in the HPI.   Allergies  Review of patient's allergies indicates no known allergies.  Home Medications   Prior to Admission medications   Medication Sig Start Date End Date Taking? Authorizing Provider  sucralfate (CARAFATE) 1 GM/10ML suspension Take 5 mLs (0.5 g total) by mouth 4 (four) times daily -  with meals and at bedtime. 01/08/15   Jennifer Piepenbrink, PA-C   BP 122/65 mmHg  Pulse 69  Temp(Src) 97.9 F (36.6 C) (Oral)  Resp 19  Ht  (1.753 m)  Wt 65.772 kg  BMI 21.40 kg/m2  SpO2 96% Physical Exam  Constitutional: He is oriented to person, place, and time. He appears well-developed and well-nourished. No distress.  HENT:  Head:  Normocephalic and atraumatic.  Mouth/Throat: Oropharynx is clear and moist.  Eyes: Conjunctivae and EOM are normal. Pupils are equal, round, and reactive to light.  Neck: Normal range of motion.  Cardiovascular: Normal rate, regular rhythm and intact distal pulses.   Pulmonary/Chest: Effort normal and breath sounds normal.  Abdominal: Soft. There is no tenderness.  Genitourinary:  GU exam chaperoned by PA Gekas:  Patient has confluence, less than 1 cm thick and indurated plaques with no erythema, warmth, ulceration very very mild tenderness to palpation. No urethral discharge, he is circumcised, no testicular pain or swelling.  Musculoskeletal: Normal range of motion.  Neurological: He is alert and oriented to person, place, and time.  Skin: Rash noted. He is not diaphoretic.  Psychiatric: He has a normal mood and affect.  Nursing note and vitals reviewed.   ED Course  Procedures (including critical care time) Labs Review Labs Reviewed  URINALYSIS, ROUTINE W REFLEX MICROSCOPIC (NOT AT [facility]) - Abnormal; Notable for the following:    Color, Urine STRAW (*)    Specific Gravity, Urine 1.004 (*)    All other components within normal limits  RPR  HIV ANTIBODY (ROUTINE TESTING)  GC/CHLAMYDIA PROBE AMP ([place]) NOT AT [facility]    Imaging Review No results found. I have personally reviewed and evaluated these images and lab results as part of my medical decision-making.   EKG Interpretation None      MDM   Final diagnoses:  Urethritis  Genital warts    Filed Vitals:  09/29/15 0453 09/29/15 1024  BP: 122/65 115/63  Pulse: 69 70  Temp: 97.9 F (36.6 C) 98.4 F (36.9 C)  TempSrc: Oral Oral  Resp: 19 16  Height: 5\' 9"  (1.753 m)   Weight: 65.772 kg   SpO2: 96% 100%    Medications  azithromycin (ZITHROMAX) tablet 1,000 mg (1,000 mg Oral Given 09/29/15 0956)  cefTRIAXone (ROCEPHIN) injection 250 mg (250 mg Intramuscular Given 09/29/15 0959)  sterile water (preservative  free) injection (10 mLs  Given 09/29/15 0958)    Juanita CraverJason Romanoff is 40 y.o. male presenting with genital rash after patient's condom broke approximately 5 days ago. He also has mild urethritis. Recommend prophylactic treatment, he understands that testing is still pending. Rash is consistent with genital warts. Patient given prescription for Kara Meadmma cooperative also recommended that he follow closely at the STD clinic, counseled on safe sex.   Evaluation does not show pathology that would require ongoing emergent intervention or inpatient treatment. Pt is hemodynamically stable and mentating appropriately. Discussed findings and plan with patient/guardian, who agrees with care plan. All questions answered. Return precautions discussed and outpatient follow up given.   New Prescriptions   IMIQUIMOD (ALDARA) 5 % CREAM    Apply topically 3 (three) times a week. Apply a thin layer 3 times/week on alternative days prior to bedtime and leave on skin for 6-10 hours. Remove by washing with mild soap and water. Continue imiquimod treatment until there is total clearance of the genital/perianal warts or a maximum duration of therapy of 16 weeks.         Wynetta Emeryicole Caylea Foronda, PA-C 09/29/15 1029  Gwyneth SproutWhitney Plunkett, MD 09/30/15 1558

## 2015-09-29 NOTE — ED Notes (Signed)
Declined W/C at D/C and was escorted to lobby by RN. 

## 2015-09-30 LAB — HIV ANTIBODY (ROUTINE TESTING W REFLEX): HIV SCREEN 4TH GENERATION: NONREACTIVE

## 2015-09-30 LAB — RPR: RPR: NONREACTIVE

## 2015-09-30 LAB — GC/CHLAMYDIA PROBE AMP (~~LOC~~) NOT AT ARMC
Chlamydia: NEGATIVE
Neisseria Gonorrhea: NEGATIVE

## 2018-03-15 ENCOUNTER — Encounter (HOSPITAL_COMMUNITY): Payer: Self-pay | Admitting: *Deleted

## 2018-03-15 ENCOUNTER — Emergency Department (HOSPITAL_COMMUNITY)
Admission: EM | Admit: 2018-03-15 | Discharge: 2018-03-15 | Disposition: A | Discharge status: Home / Self Care | Payer: BLUE CROSS/BLUE SHIELD | Attending: Emergency Medicine | Admitting: Emergency Medicine

## 2018-03-15 ENCOUNTER — Other Ambulatory Visit: Payer: Self-pay

## 2018-03-15 DIAGNOSIS — N342 Other urethritis: Secondary | ICD-10-CM | POA: Insufficient documentation

## 2018-03-15 DIAGNOSIS — N390 Urinary tract infection, site not specified: Secondary | ICD-10-CM

## 2018-03-15 DIAGNOSIS — F17210 Nicotine dependence, cigarettes, uncomplicated: Secondary | ICD-10-CM | POA: Insufficient documentation

## 2018-03-15 DIAGNOSIS — Z79899 Other long term (current) drug therapy: Secondary | ICD-10-CM | POA: Insufficient documentation

## 2018-03-15 LAB — URINALYSIS, ROUTINE W REFLEX MICROSCOPIC
BILIRUBIN URINE: NEGATIVE
Glucose, UA: NEGATIVE mg/dL
Hgb urine dipstick: NEGATIVE
Ketones, ur: 5 mg/dL — AB
LEUKOCYTES UA: NEGATIVE
Nitrite: NEGATIVE
Protein, ur: NEGATIVE mg/dL
SPECIFIC GRAVITY, URINE: 1.024 (ref 1.005–1.030)
pH: 5 (ref 5.0–8.0)

## 2018-03-15 LAB — GC/CHLAMYDIA PROBE AMP (~~LOC~~) NOT AT ARMC
CHLAMYDIA, DNA PROBE: NEGATIVE
NEISSERIA GONORRHEA: NEGATIVE

## 2018-03-15 MED ORDER — STERILE WATER FOR INJECTION IJ SOLN
INTRAMUSCULAR | Status: AC
  Administered 2018-03-15: 0.9 mL
  Filled 2018-03-15: qty 10

## 2018-03-15 MED ORDER — CEFTRIAXONE SODIUM 250 MG IJ SOLR
250.0000 mg | Freq: Once | INTRAMUSCULAR | Status: AC
  Administered 2018-03-15: 250 mg via INTRAMUSCULAR
  Filled 2018-03-15: qty 250

## 2018-03-15 MED ORDER — AZITHROMYCIN 250 MG PO TABS
1000.0000 mg | ORAL_TABLET | Freq: Once | ORAL | Status: AC
  Administered 2018-03-15: 1000 mg via ORAL
  Filled 2018-03-15: qty 4

## 2018-03-15 MED ORDER — CEPHALEXIN 250 MG PO CAPS: 250.0000 mg | ORAL_CAPSULE | Freq: Four times a day (QID) | ORAL | 0 refills | Status: DC

## 2018-03-15 NOTE — ED Notes (Signed)
Discharge instructions reviewed with patient. Patient verbalizes understanding. VSS.   

## 2018-03-15 NOTE — ED Notes (Signed)
Patient unable to give urine sample at present.

## 2018-03-15 NOTE — Discharge Instructions (Addendum)
You were treated here for STI due to your urethritis symptoms (pain in the penis when urinating) You are being treated for a urinary tract infection- please take all antibiotics Return if you are worse with fever, vomiting, or new symptoms Recheck with a primary care doctor in the next two weeks.

## 2018-03-15 NOTE — ED Notes (Signed)
Pt. Made aware for the need of urine specimen. 

## 2018-03-15 NOTE — ED Provider Notes (Signed)
[facility]-EMERGENCY DEPT Provider Note   CSN: [id number] Arrival date & time: 03/15/18  0609     History   Chief Complaint Chief Complaint  Patient presents with  . Abdominal Pain    HPI Juanita CraverJason Weinert is a 42 y.o. male.  HPI  43 year old male presents today complaining of suprapubic pain for 1 week with associated pain with urination.  He states that he feels like he has to go to the bathroom almost constantly, but that is only able to urinate small amounts.  He denies any fever, chills, nausea, vomiting vomiting, diarrhea.  He has had some similar symptoms in the past that he associates with an STI.  He states he has been with his current partner for 6 months.  He has not noted any discharge.  Pain is sharp and uncomfortable.  Past Medical History:  Diagnosis Date  . GERD (gastroesophageal reflux disease)     There are no active problems to display for this patient.   History reviewed. No pertinent surgical history.      Home Medications    Prior to Admission medications   Medication Sig Start Date End Date Taking? Authorizing Provider  imiquimod (ALDARA) 5 % cream Apply topically 3 (three) times a week. Apply a thin layer 3 times/week on alternative days prior to bedtime and leave on skin for 6-10 hours. Remove by washing with mild soap and water. Continue imiquimod treatment until there is total clearance of the genital/perianal warts or a maximum duration of therapy of 16 weeks. 09/29/15   Pisciotta, Joni ReiningNicole, PA-C  sucralfate (CARAFATE) 1 GM/10ML suspension Take 5 mLs (0.5 g total) by mouth 4 (four) times daily -  with meals and at bedtime. 01/08/15   Piepenbrink, Victorino DikeJennifer, PA-C    Family History No family history on file.  Social History Social History   Tobacco Use  . Smoking status: Current Every Day Smoker    Packs/day: 0.00    Types: Cigarettes  Substance Use Topics  . Alcohol use: Yes  . Drug use: No     Allergies   Patient has  no known allergies.   Review of Systems Review of Systems  All other systems reviewed and are negative.    Physical Exam Updated Vital Signs BP 126/80   Pulse 70   Temp 97.9 F (36.6 C) (Oral)   Resp 16   SpO2 100%   Physical Exam  Constitutional: He is oriented to person, place, and time. He appears well-developed and well-nourished.  HENT:  Head: Normocephalic and atraumatic.  Eyes: Pupils are equal, round, and reactive to light.  Pulmonary/Chest: Effort normal.  Abdominal: Soft. Normal appearance and bowel sounds are normal. There is no tenderness.  Genitourinary: Rectum normal, prostate normal, testes normal and penis normal. Rectal exam shows no tenderness. Prostate is not enlarged and not tender.  Neurological: He is alert and oriented to person, place, and time.  Skin: Skin is warm and dry. Capillary refill takes less than 2 seconds.  Nursing note and vitals reviewed.    ED Treatments / Results  Labs (all labs ordered are listed, but only abnormal results are displayed) Labs Reviewed  URINALYSIS, ROUTINE W REFLEX MICROSCOPIC - Abnormal; Notable for the following components:      Result Value   Ketones, ur 5 (*)    Bacteria, UA RARE (*)    All other components within normal limits  GC/CHLAMYDIA PROBE AMP ([place]) NOT AT [facility]    EKG None  Radiology No results found.  Procedures Procedures (including critical care time)  Medications Ordered in ED Medications - No data to display   Initial Impression / Assessment and Plan / ED Course  I have reviewed the triage vital signs and the nursing notes.  Pertinent labs & imaging results that were available during my care of the patient were reviewed by me and considered in my medical decision making (see chart for details).     42 year old male presents today with urinary frequency and urethral burning.  He does have a history of STI.  He has had multiple sexual partners in the past although is  currently in a committed relationship.  Plan to cover for sexually transmitted diseases Urine with rare bacteria and some wbc- will culture  Final Clinical Impressions(s) / ED Diagnoses   Final diagnoses:  Urethritis  Urinary tract infection without hematuria, site unspecified    ED Discharge Orders    None       Margarita Grizzle, MD 03/15/18 [phone]

## 2018-03-15 NOTE — ED Triage Notes (Signed)
Pt reports onset of lower midline abdominal pain this morning and pain with urination. Denies penile discharge or any swelling.

## 2018-03-16 LAB — URINE CULTURE
Culture: NO GROWTH
Special Requests: NORMAL

## 2019-05-10 ENCOUNTER — Emergency Department (HOSPITAL_COMMUNITY)
Admission: EM | Admit: 2019-05-10 | Discharge: 2019-05-10 | Disposition: A | Discharge status: Home / Self Care | Payer: No Typology Code available for payment source | Attending: Emergency Medicine | Admitting: Emergency Medicine

## 2019-05-10 ENCOUNTER — Encounter (HOSPITAL_COMMUNITY): Payer: Self-pay

## 2019-05-10 ENCOUNTER — Emergency Department (HOSPITAL_COMMUNITY): Payer: No Typology Code available for payment source

## 2019-05-10 ENCOUNTER — Other Ambulatory Visit: Payer: Self-pay

## 2019-05-10 DIAGNOSIS — Y93I9 Activity, other involving external motion: Secondary | ICD-10-CM | POA: Diagnosis not present

## 2019-05-10 DIAGNOSIS — F17210 Nicotine dependence, cigarettes, uncomplicated: Secondary | ICD-10-CM | POA: Diagnosis not present

## 2019-05-10 DIAGNOSIS — R410 Disorientation, unspecified: Secondary | ICD-10-CM | POA: Diagnosis not present

## 2019-05-10 DIAGNOSIS — M545 Low back pain: Secondary | ICD-10-CM | POA: Diagnosis not present

## 2019-05-10 DIAGNOSIS — S134XXA Sprain of ligaments of cervical spine, initial encounter: Secondary | ICD-10-CM | POA: Diagnosis not present

## 2019-05-10 DIAGNOSIS — R42 Dizziness and giddiness: Secondary | ICD-10-CM | POA: Insufficient documentation

## 2019-05-10 DIAGNOSIS — Y999 Unspecified external cause status: Secondary | ICD-10-CM | POA: Diagnosis not present

## 2019-05-10 DIAGNOSIS — R519 Headache, unspecified: Secondary | ICD-10-CM | POA: Diagnosis not present

## 2019-05-10 DIAGNOSIS — S199XXA Unspecified injury of neck, initial encounter: Secondary | ICD-10-CM | POA: Diagnosis present

## 2019-05-10 DIAGNOSIS — Z79899 Other long term (current) drug therapy: Secondary | ICD-10-CM | POA: Insufficient documentation

## 2019-05-10 DIAGNOSIS — Y92410 Unspecified street and highway as the place of occurrence of the external cause: Secondary | ICD-10-CM | POA: Insufficient documentation

## 2019-05-10 LAB — CBC WITH DIFFERENTIAL/PLATELET
Abs Immature Granulocytes: 0.01 10*3/uL (ref 0.00–0.07)
Basophils Absolute: 0.1 10*3/uL (ref 0.0–0.1)
Basophils Relative: 1 %
Eosinophils Absolute: 0.3 10*3/uL (ref 0.0–0.5)
Eosinophils Relative: 4 %
HCT: 53 % — ABNORMAL HIGH (ref 39.0–52.0)
Hemoglobin: 17.5 g/dL — ABNORMAL HIGH (ref 13.0–17.0)
Immature Granulocytes: 0 %
Lymphocytes Relative: 35 %
Lymphs Abs: 2.1 10*3/uL (ref 0.7–4.0)
MCH: 32.4 pg (ref 26.0–34.0)
MCHC: 33 g/dL (ref 30.0–36.0)
MCV: 98.1 fL (ref 80.0–100.0)
Monocytes Absolute: 0.7 10*3/uL (ref 0.1–1.0)
Monocytes Relative: 11 %
Neutro Abs: 3 10*3/uL (ref 1.7–7.7)
Neutrophils Relative %: 49 %
Platelets: 218 10*3/uL (ref 150–400)
RBC: 5.4 MIL/uL (ref 4.22–5.81)
RDW: 13.5 % (ref 11.5–15.5)
WBC: 6.2 10*3/uL (ref 4.0–10.5)
nRBC: 0 % (ref 0.0–0.2)

## 2019-05-10 LAB — BASIC METABOLIC PANEL
Anion gap: 9 (ref 5–15)
BUN: 23 mg/dL — ABNORMAL HIGH (ref 6–20)
CO2: 24 mmol/L (ref 22–32)
Calcium: 9.2 mg/dL (ref 8.9–10.3)
Chloride: 105 mmol/L (ref 98–111)
Creatinine, Ser: 1.28 mg/dL — ABNORMAL HIGH (ref 0.61–1.24)
GFR calc Af Amer: >60 mL/min (ref >60)
GFR calc non Af Amer: >60 mL/min (ref >60)
Glucose, Bld: 110 mg/dL — ABNORMAL HIGH (ref 70–99)
Potassium: 4 mmol/L (ref 3.5–5.1)
Sodium: 138 mmol/L (ref 135–145)

## 2019-05-10 MED ORDER — IOHEXOL 300 MG/ML  SOLN
100.0000 mL | Freq: Once | INTRAMUSCULAR | Status: AC | PRN
  Administered 2019-05-10: 100 mL via INTRAVENOUS

## 2019-05-10 MED ORDER — METHOCARBAMOL 500 MG PO TABS: 500.0000 mg | ORAL_TABLET | Freq: Two times a day (BID) | ORAL | 0 refills | Status: AC

## 2019-05-10 MED ORDER — HYDROCODONE-ACETAMINOPHEN 5-325 MG PO TABS
1.0000 | ORAL_TABLET | Freq: Once | ORAL | Status: AC
  Administered 2019-05-10: 1 via ORAL
  Filled 2019-05-10: qty 1

## 2019-05-10 MED ORDER — NAPROXEN 500 MG PO TABS: 500.0000 mg | ORAL_TABLET | Freq: Two times a day (BID) | ORAL | 0 refills | Status: AC

## 2019-05-10 MED ORDER — METHYLPREDNISOLONE 4 MG PO TBPK: ORAL_TABLET | ORAL | 0 refills | Status: DC

## 2019-05-10 MED ORDER — HYDROCODONE-ACETAMINOPHEN 5-325 MG PO TABS
1.0000 | ORAL_TABLET | Freq: Once | ORAL | Status: AC
  Administered 2019-05-10: 10:00:00 1 via ORAL
  Filled 2019-05-10: qty 1

## 2019-05-10 MED ORDER — LIDOCAINE 5 % EX PTCH
1.0000 | MEDICATED_PATCH | Freq: Once | CUTANEOUS | Status: DC
  Administered 2019-05-10: 05:00:00 1 via TRANSDERMAL
  Filled 2019-05-10: qty 1

## 2019-05-10 MED ORDER — HYDROCODONE-ACETAMINOPHEN 5-325 MG PO TABS: 1.0000 | ORAL_TABLET | Freq: Four times a day (QID) | ORAL | 0 refills | Status: DC | PRN
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
## 2019-05-10 NOTE — ED Notes (Signed)
Pt transported to MRI 

## 2019-05-10 NOTE — ED Notes (Signed)
MRI called and requested stat MRI.

## 2019-05-10 NOTE — Discharge Instructions (Addendum)
Take Medrol Dosepak until completed.  Take Robaxin as prescribed, as needed for muscle spasms.  Take Norco every 6 hours as needed for severe pain.  As discussed, these medications can make you drowsy, so do not drive or operate machinery.  After you finish Medrol Dosepak, you can take Naprosyn, but do not take this while taking the steroid or any other NSAID medication, such as ibuprofen, Advil, Aleve.  Please call [facility] on Monday and ask for Caryl Pina, and make an appointment with one of the two providers next week. I have also provided you with a number for a primary care doctor. I recommend calling and establishing care. Please return to the ER with new or worsening symptoms.    Do not drink alcohol, drive, operate machinery or participate in any other potentially dangerous activities while taking opiate pain medication as it may make you sleepy. Do not take this medication with any other sedating medications, either prescription or over-the-counter. If you were prescribed Percocet or Vicodin, do not take these with acetaminophen (Tylenol) as it is already contained within these medications and overdose of Tylenol is dangerous.   This medication is an opiate (or narcotic) pain medication and can be habit forming.  Use it as little as possible to achieve adequate pain control.  Do not use or use it with extreme caution if you have a history of opiate abuse or dependence. This medication is intended for your use only - do not give any to anyone else and keep it in a secure place where nobody else, especially children, have access to it. It will also cause or worsen constipation, so you may want to consider taking an over-the-counter stool softener while you are taking this medication.
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
## 2019-05-21 ENCOUNTER — Telehealth: Payer: Self-pay

## 2019-05-21 NOTE — Telephone Encounter (Signed)
Called patient to do their pre-visit COVID screening.  Error message when calling mobile number. Home number is aunt's cell phone. Unable to do prescreening.

## 2019-05-22 ENCOUNTER — Inpatient Hospital Stay: Payer: Self-pay

## 2019-05-26 ENCOUNTER — Ambulatory Visit (INDEPENDENT_AMBULATORY_CARE_PROVIDER_SITE_OTHER): Payer: Self-pay | Admitting: Family Medicine

## 2019-05-26 ENCOUNTER — Other Ambulatory Visit: Payer: Self-pay

## 2019-05-26 ENCOUNTER — Encounter: Payer: Self-pay | Admitting: Neurology

## 2019-05-26 VITALS — BP 116/76 | HR 85 | Temp 97.3°F | Resp 17 | Ht 69.0 in | Wt 137.0 lb

## 2019-05-26 DIAGNOSIS — M5010 Cervical disc disorder with radiculopathy, unspecified cervical region: Secondary | ICD-10-CM | POA: Insufficient documentation

## 2019-05-26 DIAGNOSIS — Z09 Encounter for follow-up examination after completed treatment for conditions other than malignant neoplasm: Secondary | ICD-10-CM

## 2019-05-26 DIAGNOSIS — M549 Dorsalgia, unspecified: Secondary | ICD-10-CM | POA: Insufficient documentation

## 2019-05-26 DIAGNOSIS — M542 Cervicalgia: Secondary | ICD-10-CM | POA: Insufficient documentation

## 2019-05-26 DIAGNOSIS — F0781 Postconcussional syndrome: Secondary | ICD-10-CM | POA: Insufficient documentation

## 2019-05-26 MED ORDER — HYDROCODONE-ACETAMINOPHEN 5-325 MG PO TABS: 1.0000 | ORAL_TABLET | Freq: Four times a day (QID) | ORAL | 0 refills | Status: AC | PRN
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
## 2019-05-26 NOTE — Patient Instructions (Signed)
Cervical Radiculopathy  Cervical radiculopathy happens when a nerve in the neck (a cervical nerve) is pinched or bruised. This condition can happen because of an injury to the cervical spine (vertebrae) in the neck, or as part of the normal aging process. Pressure on the cervical nerves can cause pain or numbness that travels from the neck all the way down into the arm and fingers. Usually, this condition gets better with rest. Treatment may be needed if the condition does not improve. What are the causes? This condition may be caused by:  A neck injury.  A bulging (herniated) disk.  Muscle spasms.  Muscle tightness in the neck because of overuse.  Arthritis.  Breakdown or degeneration in the bones and joints of the spine (spondylosis) due to aging.  Bone spurs that may develop near the cervical nerves. What are the signs or symptoms? Symptoms of this condition include:  Pain. The pain may travel from the neck to the arm and hand. The pain can be severe or irritating. It may be worse when you move your neck.  Numbness or tingling in your arm or hand.  Weakness in the affected arm and hand, in severe cases. How is this diagnosed? This condition may be diagnosed based on your symptoms, your medical history, and a physical exam. You may also have tests, including:  X-rays.  A CT scan.  An MRI.  An electromyogram (EMG).  Nerve conduction tests. How is this treated? In many cases, treatment is not needed for this condition. With rest, the condition usually gets better over time. If treatment is needed, options may include:  Wearing a soft neck collar (cervical collar) for short periods of time, as told by your health care provider.  Doing physical therapy to strengthen your neck muscles.  Taking medicines, such as NSAIDs or oral corticosteroids.  Having spinal injections, in severe cases.  Having surgery. This may be needed if other treatments do not help. Different types  of surgery may be done depending on the cause of this condition. Follow these instructions at home: If you have a cervical collar:  Wear it as told by your health care provider. Remove it only as told by your health care provider.  Ask your health care provider if you can remove the collar for cleaning and bathing. If you are allowed to remove the collar for cleaning or bathing: ? Follow instructions from your health care provider about how to remove the collar safely. ? Clean the collar by wiping it with mild soap and water and drying it completely. ? Take out any removable pads in the collar every 1-2 days, and wash them by hand with soap and water. Let them air-dry completely before you put them back in the collar. ? Check your skin under the collar for irritation or sores. If you see any, tell your health care provider. Managing pain      Take over-the-counter and prescription medicines only as told by your health care provider.  If directed, put ice on the affected area. ? If you have a soft neck collar, remove it as told by your health care provider. ? Put ice in a plastic bag. ? Place a towel between your skin and the bag. ? Leave the ice on for 20 minutes, 2-3 times a day.  If applying ice does not help, you can try using heat. Use the heat source that your health care provider recommends, such as a moist heat pack or a heating pad. ?  Place a towel between your skin and the heat source. ? Leave the heat on for 20-30 minutes. ? Remove the heat if your skin turns bright red. This is especially important if you are unable to feel pain, heat, or cold. You may have a greater risk of getting burned.  Try a gentle neck and shoulder massage to help relieve symptoms. Activity  Rest as needed.  Return to your normal activities as told by your health care provider. Ask your health care provider what activities are safe for you.  Do stretching and strengthening exercises as told by  your health care provider or physical therapist.  Do not lift anything that is heavier than 10 lb (4.5 kg) until your health care provider tells you that it is safe. General instructions  Use a flat pillow when you sleep.  Do not drive while wearing a cervical collar. If you do not have a cervical collar, ask your health care provider if it is safe to drive while your neck heals.  Ask your health care provider if the medicine prescribed to you requires you to avoid driving or using heavy machinery.  Do not use any products that contain nicotine or tobacco, such as cigarettes, e-cigarettes, and chewing tobacco. These can delay healing. If you need help quitting, ask your health care provider.  Keep all follow-up visits as told by your health care provider. This is important. Contact a health care provider if:  Your condition does not improve with treatment. Get help right away if:  Your pain gets much worse and cannot be controlled with medicines.  You have weakness or numbness in your hand, arm, face, or leg.  You have a high fever.  You have a stiff, rigid neck.  You lose control of your bowels or your bladder (have incontinence).  You have trouble with walking, balance, or speaking. Summary  Cervical radiculopathy happens when a nerve in the neck is pinched or bruised.  A nerve can get pinched from a bulging disk, arthritis, muscle spasms, or an injury to the neck.  Symptoms include pain, tingling, or numbness radiating from the neck into the arm or hand. Weakness can also occur in severe cases.  Treatment may include rest, wearing a cervical collar, and physical therapy. Medicines may be prescribed to help with pain. In severe cases, injections or surgery may be needed. This information is not intended to replace advice given to you by your health care provider. Make sure you discuss any questions you have with your health care provider. Document Released: 03/28/2001  Document Revised: 05/24/2018 Document Reviewed: 05/24/2018 Elsevier Patient Education  2020 [employer]  Post-Concussion Syndrome  Post-concussion syndrome is when symptoms last longer than normal after a head injury. What are the signs or symptoms? After a head injury, you may:  Have headaches.  Feel tired.  Feel dizzy.  Feel weak.  Have trouble seeing.  Have trouble in bright lights.  Have trouble hearing.  Not be able to remember things.  Not be able to focus.  Have trouble sleeping.  Have mood swings.  Have trouble learning new things. These can last from weeks to months. Follow these instructions at home: Medicines  Take all medicines only as told by your doctor.  Do not take prescription pain medicines. Activity  Limit activities as told by your doctor. This includes: ? Homework. ? Job-related work. ? Thinking. ? Watching TV. ? Using a computer or phone. ? Puzzles. ? Exercise. ? Sports.  Slowly  return to your normal activity as told by your doctor.  Stop an activity if you have symptoms.  Do not do anything that may cause you to get injured again. General instructions  Rest. Try to: ? Sleep 7-9 hours each night. ? Take naps or breaks when you feel tired during the day.  Do not drink alcohol until your doctor says that you can.  Keep track of your symptoms.  Keep all follow-up visits as told by your doctor. This is important. Contact a doctor if:  You do not improve.  You get worse.  You have another injury. Get help right away if:  You have a very bad headache.  You feel confused.  You feel very sleepy.  You pass out (faint).  You throw up (vomit).  You feel weak in any part of your body.  You feel numb in any part of your body.  You start shaking (have a seizure).  You have trouble talking. Summary  Post-concussion syndrome is when symptoms last longer than normal after a head injury.  Limit all activity after  your injury. Gradually return to normal activity as told by your doctor.  Rest, do not drink alcohol, and avoid prescription pain medicines after a concussion.  Call your doctor if your symptoms get worse. This information is not intended to replace advice given to you by your health care provider. Make sure you discuss any questions you have with your health care provider. Document Released: 08/10/2004 Document Revised: 04/25/2018 Document Reviewed: 08/07/2017 Elsevier Patient Education  2020 [employer].

## 2019-06-05 ENCOUNTER — Encounter (HOSPITAL_COMMUNITY): Payer: Self-pay | Admitting: Emergency Medicine

## 2019-06-05 ENCOUNTER — Emergency Department (HOSPITAL_COMMUNITY)
Admission: EM | Admit: 2019-06-05 | Discharge: 2019-06-05 | Disposition: A | Discharge status: Home / Self Care | Payer: Self-pay | Attending: Emergency Medicine | Admitting: Emergency Medicine

## 2019-06-05 ENCOUNTER — Other Ambulatory Visit: Payer: Self-pay

## 2019-06-05 DIAGNOSIS — M62830 Muscle spasm of back: Secondary | ICD-10-CM | POA: Diagnosis not present

## 2019-06-05 DIAGNOSIS — F17210 Nicotine dependence, cigarettes, uncomplicated: Secondary | ICD-10-CM | POA: Diagnosis not present

## 2019-06-05 DIAGNOSIS — M5489 Other dorsalgia: Secondary | ICD-10-CM | POA: Diagnosis present

## 2019-06-05 MED ORDER — HYDROCODONE-ACETAMINOPHEN 5-325 MG PO TABS: 2.0000 | ORAL_TABLET | ORAL | 0 refills | Status: AC | PRN
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
## 2019-06-05 NOTE — ED Notes (Signed)
Pt verbalized discharge instructions and follow up. Alert and ambulatory without assistance. No IV. Has ride home
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
## 2019-06-23 ENCOUNTER — Ambulatory Visit (INDEPENDENT_AMBULATORY_CARE_PROVIDER_SITE_OTHER): Payer: Self-pay | Admitting: Family Medicine

## 2019-06-23 DIAGNOSIS — M5010 Cervical disc disorder with radiculopathy, unspecified cervical region: Secondary | ICD-10-CM

## 2019-06-23 DIAGNOSIS — M549 Dorsalgia, unspecified: Secondary | ICD-10-CM

## 2019-06-23 DIAGNOSIS — R519 Headache, unspecified: Secondary | ICD-10-CM

## 2019-06-23 DIAGNOSIS — Z1331 Encounter for screening for depression: Secondary | ICD-10-CM

## 2019-06-23 DIAGNOSIS — Z09 Encounter for follow-up examination after completed treatment for conditions other than malignant neoplasm: Secondary | ICD-10-CM

## 2019-06-23 DIAGNOSIS — M542 Cervicalgia: Secondary | ICD-10-CM

## 2019-06-23 MED ORDER — NAPROXEN 500 MG PO TABS: ORAL_TABLET | ORAL | 2 refills | Status: AC

## 2019-06-23 MED ORDER — DULOXETINE HCL 30 MG PO CPEP: 30.0000 mg | ORAL_CAPSULE | Freq: Two times a day (BID) | ORAL | 3 refills | Status: AC

## 2019-06-23 MED ORDER — METHOCARBAMOL 750 MG PO TABS: 750.0000 mg | ORAL_TABLET | Freq: Three times a day (TID) | ORAL | 3 refills | Status: AC | PRN
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
## 2019-06-23 NOTE — Progress Notes (Signed)
States that the neck pain is still constant. Was advised to let his neck rest during the day & only wear the brace at night while he's sleeping.  Headaches & dizziness comes & goes.

## 2019-06-24 ENCOUNTER — Encounter: Payer: Self-pay | Admitting: Family Medicine
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
## 2019-07-07 ENCOUNTER — Ambulatory Visit: Payer: Self-pay | Admitting: Neurology

## 2019-07-25 ENCOUNTER — Telehealth: Payer: Self-pay | Admitting: Licensed Clinical Social Worker

## 2019-07-25 NOTE — Telephone Encounter (Signed)
Call placed to patient regarding [facility] referral. LCSW left message requesting a return call.  
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
